# Patient Record
Sex: Female | Born: 1975 | Race: White | Hispanic: No | Marital: Married | Smoking: Current every day smoker
Health system: Southern US, Community
[De-identification: ages and names within clinical notes are randomized; demographics above are authoritative.]

## PROBLEM LIST (undated history)

## (undated) DIAGNOSIS — Z9189 Other specified personal risk factors, not elsewhere classified: Secondary | ICD-10-CM

## (undated) DIAGNOSIS — E785 Hyperlipidemia, unspecified: Secondary | ICD-10-CM

## (undated) DIAGNOSIS — F419 Anxiety disorder, unspecified: Secondary | ICD-10-CM

## (undated) DIAGNOSIS — G4700 Insomnia, unspecified: Secondary | ICD-10-CM

## (undated) DIAGNOSIS — Z803 Family history of malignant neoplasm of breast: Secondary | ICD-10-CM

## (undated) DIAGNOSIS — E119 Type 2 diabetes mellitus without complications: Secondary | ICD-10-CM

## (undated) DIAGNOSIS — R87810 Cervical high risk human papillomavirus (HPV) DNA test positive: Secondary | ICD-10-CM

## (undated) DIAGNOSIS — R87612 Low grade squamous intraepithelial lesion on cytologic smear of cervix (LGSIL): Secondary | ICD-10-CM

## (undated) DIAGNOSIS — N946 Dysmenorrhea, unspecified: Secondary | ICD-10-CM

## (undated) DIAGNOSIS — R87610 Atypical squamous cells of undetermined significance on cytologic smear of cervix (ASC-US): Secondary | ICD-10-CM

## (undated) DIAGNOSIS — E282 Polycystic ovarian syndrome: Secondary | ICD-10-CM

## (undated) DIAGNOSIS — Z9289 Personal history of other medical treatment: Secondary | ICD-10-CM

## (undated) DIAGNOSIS — Z1371 Encounter for nonprocreative screening for genetic disease carrier status: Secondary | ICD-10-CM

## (undated) DIAGNOSIS — E559 Vitamin D deficiency, unspecified: Secondary | ICD-10-CM

## (undated) DIAGNOSIS — B351 Tinea unguium: Secondary | ICD-10-CM

## (undated) HISTORY — DX: Anxiety disorder, unspecified: F41.9

## (undated) HISTORY — DX: Other specified personal risk factors, not elsewhere classified: Z91.89

## (undated) HISTORY — DX: Insomnia, unspecified: G47.00

## (undated) HISTORY — DX: Low grade squamous intraepithelial lesion on cytologic smear of cervix (LGSIL): R87.612

## (undated) HISTORY — DX: Type 2 diabetes mellitus without complications: E11.9

## (undated) HISTORY — DX: Hyperlipidemia, unspecified: E78.5

## (undated) HISTORY — DX: Family history of malignant neoplasm of breast: Z80.3

## (undated) HISTORY — DX: Personal history of other medical treatment: Z92.89

## (undated) HISTORY — DX: Dysmenorrhea, unspecified: N94.6

## (undated) HISTORY — DX: Vitamin D deficiency, unspecified: E55.9

## (undated) HISTORY — DX: Cervical high risk human papillomavirus (HPV) DNA test positive: R87.810

## (undated) HISTORY — DX: Tinea unguium: B35.1

## (undated) HISTORY — DX: Polycystic ovarian syndrome: E28.2

## (undated) HISTORY — DX: Atypical squamous cells of undetermined significance on cytologic smear of cervix (ASC-US): R87.610

## (undated) HISTORY — DX: Encounter for nonprocreative screening for genetic disease carrier status: Z13.71

## (undated) HISTORY — PX: COLPOSCOPY: SHX161

---

## 2002-04-20 HISTORY — PX: LAPAROSCOPIC CHOLECYSTECTOMY: SUR755

## 2005-04-20 HISTORY — PX: SKIN LESION EXCISION: SHX2412

## 2007-08-19 HISTORY — PX: INTRAUTERINE DEVICE (IUD) INSERTION: SHX5877

## 2009-05-15 DIAGNOSIS — R87612 Low grade squamous intraepithelial lesion on cytologic smear of cervix (LGSIL): Secondary | ICD-10-CM

## 2009-05-15 HISTORY — DX: Low grade squamous intraepithelial lesion on cytologic smear of cervix (LGSIL): R87.612

## 2011-01-29 ENCOUNTER — Ambulatory Visit: Payer: Self-pay

## 2012-05-30 HISTORY — PX: IUD REMOVAL: SHX5392

## 2012-06-18 HISTORY — PX: CERVICAL BIOPSY  W/ LOOP ELECTRODE EXCISION: SUR135

## 2014-06-12 DIAGNOSIS — B351 Tinea unguium: Secondary | ICD-10-CM

## 2014-06-12 HISTORY — DX: Tinea unguium: B35.1

## 2015-02-14 ENCOUNTER — Other Ambulatory Visit: Payer: Self-pay | Admitting: Obstetrics and Gynecology

## 2015-02-14 DIAGNOSIS — Z1231 Encounter for screening mammogram for malignant neoplasm of breast: Secondary | ICD-10-CM

## 2015-02-19 ENCOUNTER — Ambulatory Visit
Admission: RE | Admit: 2015-02-19 | Discharge: 2015-02-19 | Disposition: A | Discharge status: Home / Self Care | Payer: BLUE CROSS/BLUE SHIELD | Source: Ambulatory Visit | Attending: Obstetrics and Gynecology | Admitting: Obstetrics and Gynecology

## 2015-02-19 DIAGNOSIS — Z1231 Encounter for screening mammogram for malignant neoplasm of breast: Secondary | ICD-10-CM | POA: Insufficient documentation

## 2015-02-19 DIAGNOSIS — Z9289 Personal history of other medical treatment: Secondary | ICD-10-CM

## 2015-02-19 HISTORY — DX: Personal history of other medical treatment: Z92.89

## 2015-06-19 DIAGNOSIS — Z803 Family history of malignant neoplasm of breast: Secondary | ICD-10-CM

## 2015-06-19 DIAGNOSIS — Z1371 Encounter for nonprocreative screening for genetic disease carrier status: Secondary | ICD-10-CM

## 2015-06-19 HISTORY — DX: Family history of malignant neoplasm of breast: Z80.3

## 2015-06-19 HISTORY — DX: Encounter for nonprocreative screening for genetic disease carrier status: Z13.71

## 2015-06-25 DIAGNOSIS — Z9289 Personal history of other medical treatment: Secondary | ICD-10-CM

## 2015-06-25 HISTORY — DX: Personal history of other medical treatment: Z92.89

## 2015-06-25 LAB — HM PAP SMEAR: HM Pap smear: NEGATIVE

## 2015-07-23 DIAGNOSIS — E782 Mixed hyperlipidemia: Secondary | ICD-10-CM | POA: Diagnosis not present

## 2015-07-23 DIAGNOSIS — Z6841 Body Mass Index (BMI) 40.0 and over, adult: Secondary | ICD-10-CM | POA: Diagnosis not present

## 2015-07-23 DIAGNOSIS — Z1321 Encounter for screening for nutritional disorder: Secondary | ICD-10-CM | POA: Diagnosis not present

## 2015-08-19 DIAGNOSIS — Z9189 Other specified personal risk factors, not elsewhere classified: Secondary | ICD-10-CM

## 2015-08-19 HISTORY — DX: Other specified personal risk factors, not elsewhere classified: Z91.89

## 2015-08-22 DIAGNOSIS — Z315 Encounter for genetic counseling: Secondary | ICD-10-CM | POA: Diagnosis not present

## 2015-08-22 DIAGNOSIS — Z803 Family history of malignant neoplasm of breast: Secondary | ICD-10-CM | POA: Diagnosis not present

## 2015-09-06 DIAGNOSIS — J069 Acute upper respiratory infection, unspecified: Secondary | ICD-10-CM | POA: Diagnosis not present

## 2015-10-08 DIAGNOSIS — L821 Other seborrheic keratosis: Secondary | ICD-10-CM | POA: Diagnosis not present

## 2015-10-08 DIAGNOSIS — D485 Neoplasm of uncertain behavior of skin: Secondary | ICD-10-CM | POA: Diagnosis not present

## 2015-10-08 DIAGNOSIS — L708 Other acne: Secondary | ICD-10-CM | POA: Diagnosis not present

## 2015-11-18 DIAGNOSIS — H66001 Acute suppurative otitis media without spontaneous rupture of ear drum, right ear: Secondary | ICD-10-CM | POA: Diagnosis not present

## 2015-11-18 DIAGNOSIS — K120 Recurrent oral aphthae: Secondary | ICD-10-CM | POA: Diagnosis not present

## 2016-01-23 DIAGNOSIS — Z23 Encounter for immunization: Secondary | ICD-10-CM | POA: Diagnosis not present

## 2016-02-11 ENCOUNTER — Other Ambulatory Visit: Payer: Self-pay | Admitting: Obstetrics and Gynecology

## 2016-02-11 DIAGNOSIS — Z1231 Encounter for screening mammogram for malignant neoplasm of breast: Secondary | ICD-10-CM

## 2016-02-26 ENCOUNTER — Ambulatory Visit
Admission: RE | Admit: 2016-02-26 | Discharge: 2016-02-26 | Disposition: A | Discharge status: Home / Self Care | Payer: BLUE CROSS/BLUE SHIELD | Source: Ambulatory Visit | Attending: Obstetrics and Gynecology | Admitting: Obstetrics and Gynecology

## 2016-02-26 DIAGNOSIS — Z1231 Encounter for screening mammogram for malignant neoplasm of breast: Secondary | ICD-10-CM

## 2016-04-02 DIAGNOSIS — R202 Paresthesia of skin: Secondary | ICD-10-CM | POA: Diagnosis not present

## 2016-04-02 DIAGNOSIS — M5417 Radiculopathy, lumbosacral region: Secondary | ICD-10-CM | POA: Diagnosis not present

## 2016-04-02 DIAGNOSIS — M5418 Radiculopathy, sacral and sacrococcygeal region: Secondary | ICD-10-CM | POA: Diagnosis not present

## 2016-04-02 DIAGNOSIS — M545 Low back pain: Secondary | ICD-10-CM | POA: Diagnosis not present

## 2016-05-21 DIAGNOSIS — R7309 Other abnormal glucose: Secondary | ICD-10-CM | POA: Diagnosis not present

## 2016-05-21 DIAGNOSIS — E785 Hyperlipidemia, unspecified: Secondary | ICD-10-CM | POA: Diagnosis not present

## 2016-05-21 DIAGNOSIS — E559 Vitamin D deficiency, unspecified: Secondary | ICD-10-CM | POA: Diagnosis not present

## 2016-06-01 DIAGNOSIS — H6692 Otitis media, unspecified, left ear: Secondary | ICD-10-CM | POA: Diagnosis not present

## 2016-06-21 DIAGNOSIS — H66003 Acute suppurative otitis media without spontaneous rupture of ear drum, bilateral: Secondary | ICD-10-CM | POA: Diagnosis not present

## 2016-06-21 DIAGNOSIS — J0110 Acute frontal sinusitis, unspecified: Secondary | ICD-10-CM | POA: Diagnosis not present

## 2016-06-21 DIAGNOSIS — R05 Cough: Secondary | ICD-10-CM | POA: Diagnosis not present

## 2016-06-25 ENCOUNTER — Ambulatory Visit (INDEPENDENT_AMBULATORY_CARE_PROVIDER_SITE_OTHER): Payer: BLUE CROSS/BLUE SHIELD | Admitting: Obstetrics and Gynecology

## 2016-06-25 ENCOUNTER — Encounter: Payer: Self-pay | Admitting: Obstetrics and Gynecology

## 2016-06-25 VITALS — BP 130/82 | HR 95 | Ht 61.0 in | Wt 281.0 lb

## 2016-06-25 DIAGNOSIS — J302 Other seasonal allergic rhinitis: Secondary | ICD-10-CM | POA: Diagnosis not present

## 2016-06-25 DIAGNOSIS — E1169 Type 2 diabetes mellitus with other specified complication: Secondary | ICD-10-CM | POA: Diagnosis not present

## 2016-06-25 DIAGNOSIS — Z1231 Encounter for screening mammogram for malignant neoplasm of breast: Secondary | ICD-10-CM

## 2016-06-25 DIAGNOSIS — Z9189 Other specified personal risk factors, not elsewhere classified: Secondary | ICD-10-CM | POA: Diagnosis not present

## 2016-06-25 DIAGNOSIS — Z01419 Encounter for gynecological examination (general) (routine) without abnormal findings: Secondary | ICD-10-CM

## 2016-06-25 DIAGNOSIS — E785 Hyperlipidemia, unspecified: Secondary | ICD-10-CM | POA: Diagnosis not present

## 2016-06-25 DIAGNOSIS — E119 Type 2 diabetes mellitus without complications: Secondary | ICD-10-CM | POA: Diagnosis not present

## 2016-06-25 DIAGNOSIS — Z124 Encounter for screening for malignant neoplasm of cervix: Secondary | ICD-10-CM | POA: Diagnosis not present

## 2016-06-25 DIAGNOSIS — Z1239 Encounter for other screening for malignant neoplasm of breast: Secondary | ICD-10-CM

## 2016-06-25 MED ORDER — FLUTICASONE-SODIUM CHLORIDE 50-2.7 MCG/ACT-% NA THPK: 1.0000 | PACK | Freq: Two times a day (BID) | NASAL | 5 refills | Status: DC

## 2016-06-25 NOTE — Progress Notes (Signed)
HPI:      [name] is a 41 y.o. G0P0000 who LMP was Patient's last menstrual period was 05/31/2016 (exact date)., presents today for her annual examination.  Her menses are regular every 28-30 days, lasting 10 day(s) now.  Dysmenorrhea mild, occurring first 1-2 days of flow. Takes NSAIDs with relief. She has had 1-2 days bleeding after her period stops for the past 3 months. She has a hx of PCOS.  She is single partner, contraception - none.  Last Pap: June 25, 2015  Results were: no abnormalities /neg HPV DNA Last mammogram: February 26, 2016  Results were: normal--routine follow-up in 12 months There is a FH of breast cancer in her mom and mat aunt. There is no FH of ovarian cancer. PT had My Risk testing 2017 that was negative. The patient does not do self-breast exams. IBIS=24%. She is due for screening breast MRI. She is taking Vit D supp.  Tobacco use: The patient currently smokes 1/4 packs of cigarettes per day for the past many years. Alcohol use: none Exercise: not active  She does get adequate calcium and Vitamin D in her diet.  She has a hx of pre-DM that turned into DM 10/17. She has gained 20# since 5/17. She was on metformin 500 mg daily but was increased to BID 10/17. She is doing well with BID dosing. She needs repeat labs. Since she now has type 2 Dm, she is also candidate for ACE and statin.   She needs RF on flonase for seasonal allergies. She currently has a URI and was treated for ear infection this past wknd with 3 day course of Zpak.   Past Medical History:  Diagnosis Date  . Anxiety   . Borderline diabetes   . BRCA gene mutation negative in female 06/2015  . Cervical high risk HPV (human papillomavirus) test positive 05/15/09;05/22/10;12/16/10;2014   hpv pos  . Dysmenorrhea   . Family history of breast cancer 06/2015  . History of mammogram 02/19/2015   BIRAD I  . History of Papanicolaou smear of cervix 06/25/2015   RNIL;NEG  . Hyperlipidemia   .  Increased risk of breast cancer 08/2015   IBIS=24%  . Insomnia   . Onychomycosis 06/12/2014  . Pap smear abnormality of cervix with ASCUS favoring dysplasia 05/22/10;12/16/10;11/27/11;05/30/12  . Pap smear abnormality of cervix with LGSIL 05/15/2009  . Polycystic ovaries   . Vitamin D deficiency     Past Surgical History:  Procedure Laterality Date  . CERVICAL BIOPSY  W/ LOOP ELECTRODE EXCISION  06/2012   PATH NEG  . COLPOSCOPY  05/15/09;2/17;12/3; 5/13   NO LESIONS  . INTRAUTERINE DEVICE (IUD) INSERTION  08/2007  . IUD REMOVAL  05/30/2012  . LAPAROSCOPIC CHOLECYSTECTOMY  2004  . SKIN LESION EXCISION  2007   DERMATOFIBROMA    Family History  Problem Relation Age of Onset  . Breast cancer Maternal Aunt 86  . Breast cancer Mother 38  . Anemia Mother   . Hypertension Father   . Cancer Maternal Uncle   . Diabetes Maternal Grandmother   . Hypertension Paternal Grandmother   . Cancer Cousin      ROS:  Review of Systems  Constitutional: Negative for fever, malaise/fatigue and weight loss.  HENT: Negative for congestion, ear pain and sinus pain.   Respiratory: Positive for cough and wheezing. Negative for shortness of breath.   Cardiovascular: Negative for chest pain, orthopnea and leg swelling.  Gastrointestinal: Negative for constipation, diarrhea,  nausea and vomiting.  Genitourinary: Negative for dysuria, frequency, hematuria and urgency.       Breast ROS: negative   Musculoskeletal: Negative for back pain, joint pain and myalgias.  Skin: Negative for itching and rash.  Neurological: Negative for dizziness, tingling, focal weakness and headaches.  Endo/Heme/Allergies: Negative for environmental allergies. Does not bruise/bleed easily.  Psychiatric/Behavioral: Negative for depression and suicidal ideas. The patient is not nervous/anxious and does not have insomnia.     Objective: BP 130/82 (Patient Position: Sitting)   Pulse 95   Ht 5' 1"  (1.549 m)   Wt 281 lb (127.5 kg)    LMP 05/31/2016 (Exact Date)   BMI 53.09 kg/m    Physical Exam  Constitutional: She is oriented to person, place, and time. She appears well-developed and well-nourished.  Genitourinary: Vagina normal and uterus normal. No erythema or tenderness in the vagina. No vaginal discharge found. Right adnexum does not display mass and does not display tenderness. Left adnexum does not display mass and does not display tenderness. Cervix does not exhibit motion tenderness or polyp. Uterus is not enlarged or tender.  Neck: Normal range of motion. No thyromegaly present.  Cardiovascular: Normal rate, regular rhythm and normal heart sounds.   No murmur heard. Pulmonary/Chest: Effort normal and breath sounds normal. Right breast exhibits no mass, no nipple discharge, no skin change and no tenderness. Left breast exhibits no mass, no nipple discharge, no skin change and no tenderness.  Abdominal: Soft. There is no tenderness. There is no guarding.  Musculoskeletal: Normal range of motion.  Neurological: She is alert and oriented to person, place, and time. No cranial nerve deficit.  Psychiatric: She has a normal mood and affect. Her behavior is normal.  Vitals reviewed.   Results: No results found for this or any previous visit (from the past 24 hour(s)).  Assessment/Plan: Encounter for annual routine gynecological examination  Cervical cancer screening - Plan: Pap IG and HPV (high risk) DNA detection  Hyperlipidemia associated with type 2 diabetes mellitus ([place]) - Check labs. Will add statin due to DM, dose dependent on lab resutls.  - Plan: Pap IG and HPV (high risk) DNA detection, Comprehensive metabolic panel, Hemoglobin A1c, Urine Microalbumin w/creat. ratio, Lipid Panel With LDL/HDL Ratio, CANCELED: Lipid panel  Type 2 diabetes mellitus without complication, without long-term current use of insulin (H[facility]) - Check labs. Pt on metformin 500 mg BID. Diet/wt loss.  - Plan: Pap IG and HPV (high risk)  DNA detection, Comprehensive metabolic panel, Hemoglobin A1c, Urine Microalbumin w/creat. ratio, CANCELED: Lipid panel  Screening for breast cancer - Pt to sched through work, due 11/18. - Plan: MM DIGITAL SCREENING BILATERAL  Increased risk of breast cancer - My RIsk neg. IBIS=24%. DIscussed scr breast MRI before 5/18 if pt desires. Cont Vit D supp/yearly mammos. - Plan: MM DIGITAL SCREENING BILATERAL  Chronic seasonal allergic rhinitis, unspecified trigger              GYN counsel breast self exam, mammography screening, adequate intake of calcium and vitamin D, diet and exercise     F/U  Return in about 1 year (around 06/25/2017). for annual; depending on lab results.  Ixel Boehning B. Anaalicia Reimann, PA-C 06/25/2016 10:14 AM

## 2016-06-26 LAB — COMPREHENSIVE METABOLIC PANEL
A/G RATIO: 1.3 (ref 1.2–2.2)
ALK PHOS: 83 IU/L (ref 39–117)
ALT: 21 IU/L (ref 0–32)
AST: 16 IU/L (ref 0–40)
Albumin: 3.9 g/dL (ref 3.5–5.5)
BUN/Creatinine Ratio: 21 (ref 9–23)
BUN: 15 mg/dL (ref 6–24)
Bilirubin Total: <0.2 mg/dL (ref 0.0–1.2)
CO2: 18 mmol/L (ref 18–29)
Calcium: 9.2 mg/dL (ref 8.7–10.2)
Chloride: 102 mmol/L (ref 96–106)
Creatinine, Ser: 0.7 mg/dL (ref 0.57–1.00)
GFR calc Af Amer: 124 mL/min/{1.73_m2} (ref >59)
GFR calc non Af Amer: 107 mL/min/{1.73_m2} (ref >59)
GLOBULIN, TOTAL: 3 g/dL (ref 1.5–4.5)
Glucose: 121 mg/dL — ABNORMAL HIGH (ref 65–99)
Potassium: 5 mmol/L (ref 3.5–5.2)
SODIUM: 139 mmol/L (ref 134–144)
Total Protein: 6.9 g/dL (ref 6.0–8.5)

## 2016-06-26 LAB — HEMOGLOBIN A1C
ESTIMATED AVERAGE GLUCOSE: 146 mg/dL
HEMOGLOBIN A1C: 6.7 % — AB (ref 4.8–5.6)

## 2016-06-26 LAB — MICROALBUMIN / CREATININE URINE RATIO
Creatinine, Urine: 154 mg/dL
Microalb/Creat Ratio: 7.1 mg/g creat (ref 0.0–30.0)
Microalbumin, Urine: 10.9 ug/mL

## 2016-06-26 LAB — LIPID PANEL WITH LDL/HDL RATIO
CHOLESTEROL TOTAL: 160 mg/dL (ref 100–199)
HDL: 37 mg/dL — ABNORMAL LOW (ref >39)
LDL CALC: 80 mg/dL (ref 0–99)
LDl/HDL Ratio: 2.2 ratio units (ref 0.0–3.2)
Triglycerides: 216 mg/dL — ABNORMAL HIGH (ref 0–149)
VLDL CHOLESTEROL CAL: 43 mg/dL — AB (ref 5–40)

## 2016-06-29 ENCOUNTER — Telehealth: Payer: Self-pay | Admitting: Obstetrics and Gynecology

## 2016-06-29 DIAGNOSIS — E119 Type 2 diabetes mellitus without complications: Secondary | ICD-10-CM | POA: Insufficient documentation

## 2016-06-29 MED ORDER — METFORMIN HCL 500 MG PO TABS: 500.0000 mg | ORAL_TABLET | Freq: Two times a day (BID) | ORAL | 2 refills | Status: DC

## 2016-06-29 MED ORDER — ATORVASTATIN CALCIUM 10 MG PO TABS: 10.0000 mg | ORAL_TABLET | Freq: Every day | ORAL | 2 refills | Status: DC

## 2016-06-29 MED ORDER — LISINOPRIL 5 MG PO TABS: ORAL_TABLET | ORAL | 2 refills | Status: DC

## 2016-06-29 NOTE — Addendum Note (Signed)
Addended by: Althea GrimmerOPLAND, Anjulie Dipierro B on: 06/29/2016 12:06 PM   Modules accepted: Orders

## 2016-06-29 NOTE — Telephone Encounter (Signed)
PT aware of abn labs. Cont metformin 500 mg BID. Add lisinopril 5 mg and lipitor 10 mg. Pt aware of side effects. Rechk labs in 3 months.

## 2016-06-30 LAB — PAP IG AND HPV HIGH-RISK
HPV, high-risk: NEGATIVE
PAP Smear Comment: 0

## 2016-07-27 ENCOUNTER — Other Ambulatory Visit: Payer: Self-pay | Admitting: Obstetrics and Gynecology

## 2016-07-27 DIAGNOSIS — E785 Hyperlipidemia, unspecified: Principal | ICD-10-CM

## 2016-07-27 DIAGNOSIS — E1169 Type 2 diabetes mellitus with other specified complication: Secondary | ICD-10-CM

## 2016-07-27 DIAGNOSIS — E119 Type 2 diabetes mellitus without complications: Secondary | ICD-10-CM

## 2016-07-27 MED ORDER — ATORVASTATIN CALCIUM 10 MG PO TABS: 10.0000 mg | ORAL_TABLET | Freq: Every day | ORAL | 0 refills | Status: DC

## 2016-07-27 MED ORDER — LISINOPRIL 5 MG PO TABS: ORAL_TABLET | ORAL | 0 refills | Status: DC

## 2016-07-27 MED ORDER — METFORMIN HCL 500 MG PO TABS: 500.0000 mg | ORAL_TABLET | Freq: Two times a day (BID) | ORAL | 0 refills | Status: DC

## 2016-08-10 ENCOUNTER — Other Ambulatory Visit: Payer: Self-pay | Admitting: Obstetrics and Gynecology

## 2016-08-10 DIAGNOSIS — E119 Type 2 diabetes mellitus without complications: Secondary | ICD-10-CM

## 2016-08-10 MED ORDER — METFORMIN HCL 500 MG PO TABS: 500.0000 mg | ORAL_TABLET | Freq: Two times a day (BID) | ORAL | 0 refills | Status: DC

## 2016-08-13 ENCOUNTER — Encounter: Payer: Self-pay | Admitting: Obstetrics and Gynecology

## 2016-08-14 ENCOUNTER — Encounter: Payer: Self-pay | Admitting: Obstetrics and Gynecology

## 2016-09-24 ENCOUNTER — Other Ambulatory Visit: Payer: Self-pay | Admitting: Obstetrics and Gynecology

## 2016-09-24 DIAGNOSIS — E119 Type 2 diabetes mellitus without complications: Secondary | ICD-10-CM

## 2016-09-24 MED ORDER — LISINOPRIL 5 MG PO TABS: ORAL_TABLET | ORAL | 0 refills | Status: DC

## 2016-09-24 MED ORDER — METFORMIN HCL 500 MG PO TABS: 500.0000 mg | ORAL_TABLET | Freq: Two times a day (BID) | ORAL | 0 refills | Status: DC

## 2016-10-15 ENCOUNTER — Encounter: Payer: Self-pay | Admitting: Obstetrics and Gynecology

## 2016-11-24 ENCOUNTER — Other Ambulatory Visit: Payer: Self-pay | Admitting: Obstetrics and Gynecology

## 2016-11-24 DIAGNOSIS — E119 Type 2 diabetes mellitus without complications: Secondary | ICD-10-CM

## 2016-11-24 DIAGNOSIS — E1169 Type 2 diabetes mellitus with other specified complication: Secondary | ICD-10-CM

## 2016-11-24 DIAGNOSIS — E785 Hyperlipidemia, unspecified: Secondary | ICD-10-CM

## 2016-11-27 ENCOUNTER — Other Ambulatory Visit: Payer: BLUE CROSS/BLUE SHIELD

## 2016-11-27 DIAGNOSIS — E1169 Type 2 diabetes mellitus with other specified complication: Secondary | ICD-10-CM

## 2016-11-27 DIAGNOSIS — E119 Type 2 diabetes mellitus without complications: Secondary | ICD-10-CM | POA: Diagnosis not present

## 2016-11-27 DIAGNOSIS — E785 Hyperlipidemia, unspecified: Principal | ICD-10-CM

## 2016-11-28 LAB — COMPREHENSIVE METABOLIC PANEL
ALBUMIN: 4 g/dL (ref 3.5–5.5)
ALK PHOS: 91 IU/L (ref 39–117)
ALT: 17 IU/L (ref 0–32)
AST: 16 IU/L (ref 0–40)
Albumin/Globulin Ratio: 1.7 (ref 1.2–2.2)
BUN / CREAT RATIO: 13 (ref 9–23)
BUN: 9 mg/dL (ref 6–24)
CHLORIDE: 105 mmol/L (ref 96–106)
CO2: 21 mmol/L (ref 20–29)
Calcium: 9 mg/dL (ref 8.7–10.2)
Creatinine, Ser: 0.71 mg/dL (ref 0.57–1.00)
GFR calc Af Amer: 121 mL/min/{1.73_m2} (ref >59)
GFR calc non Af Amer: 105 mL/min/{1.73_m2} (ref >59)
GLUCOSE: 110 mg/dL — AB (ref 65–99)
Globulin, Total: 2.4 g/dL (ref 1.5–4.5)
Potassium: 5 mmol/L (ref 3.5–5.2)
SODIUM: 138 mmol/L (ref 134–144)
Total Protein: 6.4 g/dL (ref 6.0–8.5)

## 2016-11-28 LAB — LIPID PANEL
CHOLESTEROL TOTAL: 121 mg/dL (ref 100–199)
Chol/HDL Ratio: 3.5 ratio (ref 0.0–4.4)
HDL: 35 mg/dL — ABNORMAL LOW (ref >39)
LDL Calculated: 63 mg/dL (ref 0–99)
Triglycerides: 117 mg/dL (ref 0–149)
VLDL Cholesterol Cal: 23 mg/dL (ref 5–40)

## 2016-11-28 LAB — HEMOGLOBIN A1C
ESTIMATED AVERAGE GLUCOSE: 146 mg/dL
Hgb A1c MFr Bld: 6.7 % — ABNORMAL HIGH (ref 4.8–5.6)

## 2016-12-02 ENCOUNTER — Telehealth: Payer: Self-pay | Admitting: Obstetrics and Gynecology

## 2016-12-02 DIAGNOSIS — E119 Type 2 diabetes mellitus without complications: Secondary | ICD-10-CM

## 2016-12-02 DIAGNOSIS — E785 Hyperlipidemia, unspecified: Principal | ICD-10-CM

## 2016-12-02 DIAGNOSIS — E1169 Type 2 diabetes mellitus with other specified complication: Secondary | ICD-10-CM

## 2016-12-02 MED ORDER — ATORVASTATIN CALCIUM 10 MG PO TABS: 10.0000 mg | ORAL_TABLET | Freq: Every day | ORAL | 1 refills | Status: DC

## 2016-12-02 MED ORDER — LISINOPRIL 5 MG PO TABS: ORAL_TABLET | ORAL | 1 refills | Status: DC

## 2016-12-02 MED ORDER — METFORMIN HCL 500 MG PO TABS: 500.0000 mg | ORAL_TABLET | Freq: Two times a day (BID) | ORAL | 1 refills | Status: DC

## 2016-12-02 NOTE — Telephone Encounter (Signed)
Pt aware of lab results. Doing well on meds, no side effects. Taking lipitor 10 mg daily due to DM, metformin 500 mg BID, and lisinopril daily. HgA1C is stable at 6.7%. Cont meds. Rx RF for 6 months. Repeat labs in 6 months. Annual due in 7 months.

## 2016-12-06 ENCOUNTER — Encounter: Payer: Self-pay | Admitting: Obstetrics and Gynecology

## 2016-12-15 ENCOUNTER — Emergency Department
Admission: EM | Admit: 2016-12-15 | Discharge: 2016-12-15 | Disposition: A | Discharge status: Home / Self Care | Payer: BLUE CROSS/BLUE SHIELD | Attending: Emergency Medicine | Admitting: Emergency Medicine

## 2016-12-15 ENCOUNTER — Encounter: Payer: Self-pay | Admitting: Emergency Medicine

## 2016-12-15 DIAGNOSIS — M549 Dorsalgia, unspecified: Secondary | ICD-10-CM | POA: Diagnosis present

## 2016-12-15 DIAGNOSIS — Z7984 Long term (current) use of oral hypoglycemic drugs: Secondary | ICD-10-CM | POA: Diagnosis not present

## 2016-12-15 DIAGNOSIS — F17210 Nicotine dependence, cigarettes, uncomplicated: Secondary | ICD-10-CM | POA: Insufficient documentation

## 2016-12-15 DIAGNOSIS — E119 Type 2 diabetes mellitus without complications: Secondary | ICD-10-CM | POA: Insufficient documentation

## 2016-12-15 DIAGNOSIS — Z79899 Other long term (current) drug therapy: Secondary | ICD-10-CM | POA: Diagnosis not present

## 2016-12-15 DIAGNOSIS — M791 Myalgia: Secondary | ICD-10-CM | POA: Diagnosis not present

## 2016-12-15 DIAGNOSIS — R1031 Right lower quadrant pain: Secondary | ICD-10-CM | POA: Diagnosis not present

## 2016-12-15 DIAGNOSIS — M545 Low back pain: Secondary | ICD-10-CM | POA: Diagnosis not present

## 2016-12-15 DIAGNOSIS — R399 Unspecified symptoms and signs involving the genitourinary system: Secondary | ICD-10-CM | POA: Diagnosis not present

## 2016-12-15 LAB — COMPREHENSIVE METABOLIC PANEL
ALBUMIN: 3.9 g/dL (ref 3.5–5.0)
ALT: 19 U/L (ref 14–54)
ANION GAP: 10 (ref 5–15)
AST: 19 U/L (ref 15–41)
Alkaline Phosphatase: 82 U/L (ref 38–126)
BILIRUBIN TOTAL: 0.5 mg/dL (ref 0.3–1.2)
BUN: 13 mg/dL (ref 6–20)
CO2: 21 mmol/L — ABNORMAL LOW (ref 22–32)
Calcium: 9.2 mg/dL (ref 8.9–10.3)
Chloride: 105 mmol/L (ref 101–111)
Creatinine, Ser: 0.7 mg/dL (ref 0.44–1.00)
GFR calc non Af Amer: >60 mL/min (ref >60)
Glucose, Bld: 86 mg/dL (ref 65–99)
POTASSIUM: 4.2 mmol/L (ref 3.5–5.1)
SODIUM: 136 mmol/L (ref 135–145)
TOTAL PROTEIN: 7.4 g/dL (ref 6.5–8.1)

## 2016-12-15 LAB — URINALYSIS, COMPLETE (UACMP) WITH MICROSCOPIC
Bilirubin Urine: NEGATIVE
Glucose, UA: NEGATIVE mg/dL
Hgb urine dipstick: NEGATIVE
KETONES UR: NEGATIVE mg/dL
Leukocytes, UA: NEGATIVE
Nitrite: NEGATIVE
PROTEIN: NEGATIVE mg/dL
Specific Gravity, Urine: 1.01 (ref 1.005–1.030)
pH: 5 (ref 5.0–8.0)

## 2016-12-15 LAB — CBC
HCT: 40.1 % (ref 35.0–47.0)
HEMOGLOBIN: 13.7 g/dL (ref 12.0–16.0)
MCH: 31 pg (ref 26.0–34.0)
MCHC: 34.2 g/dL (ref 32.0–36.0)
MCV: 90.7 fL (ref 80.0–100.0)
Platelets: 383 10*3/uL (ref 150–440)
RBC: 4.42 MIL/uL (ref 3.80–5.20)
RDW: 13.6 % (ref 11.5–14.5)
WBC: 17 10*3/uL — ABNORMAL HIGH (ref 3.6–11.0)

## 2016-12-15 LAB — LIPASE, BLOOD: Lipase: 46 U/L (ref 11–51)

## 2016-12-15 MED ORDER — DIAZEPAM 5 MG PO TABS: 5.0000 mg | ORAL_TABLET | Freq: Three times a day (TID) | ORAL | 0 refills | Status: DC | PRN

## 2016-12-15 MED ORDER — DIAZEPAM 5 MG PO TABS
5.0000 mg | ORAL_TABLET | Freq: Once | ORAL | Status: AC
  Administered 2016-12-15: 5 mg via ORAL
  Filled 2016-12-15: qty 1

## 2016-12-15 MED ORDER — KETOROLAC TROMETHAMINE 60 MG/2ML IM SOLN
15.0000 mg | Freq: Once | INTRAMUSCULAR | Status: AC
  Administered 2016-12-15: 15 mg via INTRAMUSCULAR
  Filled 2016-12-15: qty 2

## 2016-12-15 MED ORDER — KETOROLAC TROMETHAMINE 10 MG PO TABS: 10.0000 mg | ORAL_TABLET | Freq: Four times a day (QID) | ORAL | 0 refills | Status: DC | PRN

## 2016-12-15 NOTE — ED Provider Notes (Signed)
[facility] Emergency Department Provider Note  ____________________________________________  Time seen: Approximately 5:13 PM  I have reviewed the triage vital signs and the nursing notes.   HISTORY  Chief Complaint Abdominal Pain    HPI Jaime Gilmore is a 41 y.o. female who complains of right-sided abdominal pain that started yesterday worse with movement while she was at the [employer] shopping. No slips trips falls, no specific movement or activity that precipitated it that she can think of. She had this once before and it resolved after a few days spontaneously. No nausea vomiting diarrhea fevers chills or sweats. No chest pain or shortness of breath.  Back pain is at the right flank. Worse with any movement. Better with rest. Moderate, crampy. Constant waxing and waning.     Past Medical History:  Diagnosis Date  . Anxiety   . Borderline diabetes   . BRCA gene mutation negative in female 06/2015  . Cervical high risk HPV (human papillomavirus) test positive 05/15/09;05/22/10;12/16/10;2014   hpv pos  . Dysmenorrhea   . Family history of breast cancer 06/2015  . History of mammogram 02/19/2015   BIRAD I  . History of Papanicolaou smear of cervix 06/25/2015   RNIL;NEG  . Hyperlipidemia   . Increased risk of breast cancer 08/2015   IBIS=24%  . Insomnia   . Onychomycosis 06/12/2014  . Pap smear abnormality of cervix with ASCUS favoring dysplasia 05/22/10;12/16/10;11/27/11;05/30/12  . Pap smear abnormality of cervix with LGSIL 05/15/2009  . Polycystic ovaries   . Vitamin D deficiency      Patient Active Problem List   Diagnosis Date Noted  . Type 2 diabetes mellitus (HCC) 06/29/2016  . Encounter for annual routine gynecological examination 06/25/2016  . Increased risk of breast cancer 06/25/2016     Past Surgical History:  Procedure Laterality Date  . CERVICAL BIOPSY  W/ LOOP ELECTRODE EXCISION  06/2012   PATH NEG  . COLPOSCOPY   05/15/09;2/17;12/3; 5/13   NO LESIONS  . INTRAUTERINE DEVICE (IUD) INSERTION  08/2007  . IUD REMOVAL  05/30/2012  . LAPAROSCOPIC CHOLECYSTECTOMY  2004  . SKIN LESION EXCISION  2007   DERMATOFIBROMA     Prior to Admission medications   Medication Sig Start Date End Date Taking? Authorizing Provider  ALPRAZolam (XANAX) 0.5 MG tablet Take 0.5 mg by mouth at bedtime as needed for anxiety (sxs). 04/23/14   [provider]  atorvastatin (LIPITOR) 10 MG tablet Take 1 tablet (10 mg total) by mouth daily. 12/02/16   Copland, Alicia B, PA-C  Cholecalciferol (VITAMIN D3) 5000 units TABS Take by mouth.    [provider]  diazepam (VALIUM) 5 MG tablet Take 1 tablet (5 mg total) by mouth every 8 (eight) hours as needed for muscle spasms. 12/15/16   Stafford, Phillip, MD  Fluticasone-Sodium Chloride 50-2.7 MCG/ACT-% THPK Place 1 spray into the nose 2 (two) times daily. 06/25/16   Copland, Alicia B, PA-C  ketorolac (TORADOL) 10 MG tablet Take 1 tablet (10 mg total) by mouth every 6 (six) hours as needed for moderate pain. 12/15/16   Stafford, Phillip, MD  lisinopril (PRINIVIL) 5 MG tablet Take 1 tablet daily 12/02/16   Copland, Alicia B, PA-C  metFORMIN (GLUCOPHAGE) 500 MG tablet Take 1 tablet (500 mg total) by mouth 2 (two) times daily with a meal. 12/02/16   Copland, Alicia B, PA-C  phentermine 37.5 MG capsule Take 37.5 mg by mouth every morning. 11/05/14   [provider]       Allergies Patient has no known allergies.   Family History  Problem Relation Age of Onset  . Breast cancer Maternal Aunt 46  . Breast cancer Mother 41  . Anemia Mother   . Hypertension Father   . Cancer Maternal Uncle   . Diabetes Maternal Grandmother   . Hypertension Paternal Grandmother   . Cancer Cousin     Social History Social History  Substance Use Topics  . Smoking status: Current Every Day Smoker    Packs/day: 1.00  . Smokeless tobacco: Never Used  . Alcohol use No    Review of  Systems  Constitutional:   No fever or chills.  ENT:   No sore throat. No rhinorrhea. Cardiovascular:   No chest pain or syncope. Respiratory:   No dyspnea or cough. Gastrointestinal:   Negative for abdominal pain, vomiting and diarrhea. No urinary symptoms Musculoskeletal:   Right lower back pain as above. All other systems reviewed and are negative except as documented above in ROS and HPI.  ____________________________________________   PHYSICAL EXAM:  VITAL SIGNS: ED Triage Vitals  Enc Vitals Group     BP 12/15/16 1312 100/74     Pulse Rate 12/15/16 1312 100     Resp 12/15/16 1312 18     Temp 12/15/16 1312 98.2 F (36.8 C)     Temp Source 12/15/16 1312 Oral     SpO2 12/15/16 1312 100 %     Weight 12/15/16 1312 275 lb (124.7 kg)     Height 12/15/16 1312 5' 1" (1.549 m)     Head Circumference --      Peak Flow --      Pain Score 12/15/16 1311 6     Pain Loc --      Pain Edu? --      Excl. in [place]? --     Vital signs reviewed, nursing assessments reviewed.   Constitutional:   Alert and oriented. Well appearing and in no distress. Eyes:   No scleral icterus.  EOMI. No nystagmus. No conjunctival pallor. PERRL. ENT   Head:   Normocephalic and atraumatic.   Nose:   No congestion/rhinnorhea.    Mouth/Throat:   MMM, no pharyngeal erythema. No peritonsillar mass.    Neck:   No meningismus. Full ROM Hematological/Lymphatic/Immunilogical:   No cervical lymphadenopathy. Cardiovascular:   RRR. Symmetric bilateral radial and DP pulses.  No murmurs.  Respiratory:   Normal respiratory effort without tachypnea/retractions. Breath sounds are clear and equal bilaterally. No wheezes/rales/rhonchi. Gastrointestinal:   Soft and nontender. Non distended. There is no CVA tenderness.  No rebound, rigidity, or guarding. Genitourinary:   deferred Musculoskeletal:   Normal range of motion in all extremities. No joint effusions.  No lower extremity tenderness.  No edema.right lower  back very tender to the touch in the paraspinous musculature. Pain is worsened with twisting motion of the trunk to the left. Neurologic:   Normal speech and language.  Motor grossly intact. No gross focal neurologic deficits are appreciated.  Skin:    Skin is warm, dry and intact. No rash noted.  No petechiae, purpura, or bullae.  ____________________________________________    LABS (pertinent positives/negatives) (all labs ordered are listed, but only abnormal results are displayed) Labs Reviewed  COMPREHENSIVE METABOLIC PANEL - Abnormal; Notable for the following:       Result Value   CO2 21 (*)    All other components within normal limits  CBC - Abnormal; Notable for the following:    WBC 17.0 (*)  All other components within normal limits  URINALYSIS, COMPLETE (UACMP) WITH MICROSCOPIC - Abnormal; Notable for the following:    Color, Urine YELLOW (*)    APPearance CLEAR (*)    Bacteria, UA FEW (*)    Squamous Epithelial / LPF 0-5 (*)    All other components within normal limits  LIPASE, BLOOD  POC URINE PREG, ED   ____________________________________________   EKG    ____________________________________________    RADIOLOGY  No results found.  ____________________________________________   PROCEDURES Procedures  ____________________________________________   INITIAL IMPRESSION / ASSESSMENT AND PLAN / ED COURSE  Pertinent labs & imaging results that were available during my care of the patient were reviewed by me and considered in my medical decision making (see chart for details).  Patient well appearing no acute distress, reports that she feels she just cramp. Clinically this appears to be the case that she does havemuscular tenderness.Considering the patient's symptoms, medical history, and physical examination today, I have low suspicion for cholecystitis or biliary pathology, pancreatitis, perforation or bowel obstruction, hernia, intra-abdominal  abscess, AAA or dissection, volvulus or intussusception, mesenteric ischemia, or appendicitis.  Vital signs are normal, she does have a leukocytosis of 17,000 which I think is pain related and not due to acute infection. Abdomen is benign and nonsurgical. Low suspicion for epidural abscess. No evidence of urinary tract infection.Toradol, Valium, follow-up primary care.      ____________________________________________   FINAL CLINICAL IMPRESSION(S) / ED DIAGNOSES  Final diagnoses:  Musculoskeletal back pain      New Prescriptions   DIAZEPAM (VALIUM) 5 MG TABLET    Take 1 tablet (5 mg total) by mouth every 8 (eight) hours as needed for muscle spasms.   KETOROLAC (TORADOL) 10 MG TABLET    Take 1 tablet (10 mg total) by mouth every 6 (six) hours as needed for moderate pain.     Portions of this note were generated with dragon dictation software. Dictation errors may occur despite best attempts at proofreading.    Carrie Mew, MD 12/15/16 [phone]

## 2016-12-15 NOTE — ED Triage Notes (Signed)
Pt to ed with c/o right side abd pain that started yesterday, reports pain worse with movement.  Sent from [facility]   Denies n/v/d.

## 2016-12-22 ENCOUNTER — Telehealth: Payer: Self-pay | Admitting: Obstetrics and Gynecology

## 2016-12-22 ENCOUNTER — Encounter: Payer: Self-pay | Admitting: Obstetrics and Gynecology

## 2016-12-22 ENCOUNTER — Other Ambulatory Visit: Payer: Self-pay | Admitting: Obstetrics and Gynecology

## 2016-12-22 DIAGNOSIS — Z23 Encounter for immunization: Secondary | ICD-10-CM

## 2016-12-22 NOTE — Telephone Encounter (Signed)
Patient has an appointment tomorrow for tdap.  Please place order in Epic.

## 2016-12-22 NOTE — Telephone Encounter (Signed)
Done

## 2016-12-23 ENCOUNTER — Ambulatory Visit: Payer: BLUE CROSS/BLUE SHIELD

## 2017-01-05 ENCOUNTER — Encounter: Payer: Self-pay | Admitting: Obstetrics and Gynecology

## 2017-01-21 DIAGNOSIS — Z23 Encounter for immunization: Secondary | ICD-10-CM | POA: Diagnosis not present

## 2017-03-04 ENCOUNTER — Encounter: Payer: Self-pay | Admitting: Obstetrics and Gynecology

## 2017-03-04 ENCOUNTER — Ambulatory Visit
Admission: RE | Admit: 2017-03-04 | Discharge: 2017-03-04 | Disposition: A | Discharge status: Home / Self Care | Payer: BLUE CROSS/BLUE SHIELD | Source: Ambulatory Visit | Attending: Obstetrics and Gynecology | Admitting: Obstetrics and Gynecology

## 2017-03-04 DIAGNOSIS — Z1231 Encounter for screening mammogram for malignant neoplasm of breast: Secondary | ICD-10-CM | POA: Insufficient documentation

## 2017-03-04 DIAGNOSIS — Z1239 Encounter for other screening for malignant neoplasm of breast: Secondary | ICD-10-CM

## 2017-03-04 DIAGNOSIS — Z9189 Other specified personal risk factors, not elsewhere classified: Secondary | ICD-10-CM

## 2017-05-24 ENCOUNTER — Encounter: Payer: Self-pay | Admitting: Obstetrics and Gynecology

## 2017-05-24 NOTE — Telephone Encounter (Signed)
Please advise 

## 2017-06-06 ENCOUNTER — Other Ambulatory Visit: Payer: Self-pay | Admitting: Obstetrics and Gynecology

## 2017-06-06 DIAGNOSIS — E119 Type 2 diabetes mellitus without complications: Secondary | ICD-10-CM

## 2017-06-06 DIAGNOSIS — E785 Hyperlipidemia, unspecified: Principal | ICD-10-CM

## 2017-06-06 DIAGNOSIS — E1169 Type 2 diabetes mellitus with other specified complication: Secondary | ICD-10-CM

## 2017-06-15 ENCOUNTER — Encounter: Payer: Self-pay | Admitting: Obstetrics and Gynecology

## 2017-06-15 ENCOUNTER — Other Ambulatory Visit: Payer: Self-pay | Admitting: Obstetrics and Gynecology

## 2017-06-15 DIAGNOSIS — E785 Hyperlipidemia, unspecified: Principal | ICD-10-CM

## 2017-06-15 DIAGNOSIS — E1169 Type 2 diabetes mellitus with other specified complication: Secondary | ICD-10-CM

## 2017-06-15 DIAGNOSIS — E119 Type 2 diabetes mellitus without complications: Secondary | ICD-10-CM

## 2017-06-16 NOTE — Telephone Encounter (Signed)
Please advise 

## 2017-06-16 NOTE — Telephone Encounter (Signed)
Please place order.

## 2017-06-17 MED ORDER — ATORVASTATIN CALCIUM 10 MG PO TABS: 10.0000 mg | ORAL_TABLET | Freq: Every day | ORAL | 0 refills | Status: DC

## 2017-06-24 ENCOUNTER — Encounter: Payer: Self-pay | Admitting: Obstetrics and Gynecology

## 2017-06-28 ENCOUNTER — Other Ambulatory Visit: Payer: BLUE CROSS/BLUE SHIELD

## 2017-06-28 DIAGNOSIS — E785 Hyperlipidemia, unspecified: Secondary | ICD-10-CM

## 2017-06-28 DIAGNOSIS — E119 Type 2 diabetes mellitus without complications: Secondary | ICD-10-CM | POA: Diagnosis not present

## 2017-06-28 DIAGNOSIS — E1169 Type 2 diabetes mellitus with other specified complication: Secondary | ICD-10-CM | POA: Diagnosis not present

## 2017-06-29 ENCOUNTER — Telehealth: Payer: Self-pay | Admitting: Obstetrics and Gynecology

## 2017-06-29 DIAGNOSIS — E785 Hyperlipidemia, unspecified: Secondary | ICD-10-CM

## 2017-06-29 DIAGNOSIS — E1169 Type 2 diabetes mellitus with other specified complication: Secondary | ICD-10-CM

## 2017-06-29 DIAGNOSIS — Z1321 Encounter for screening for nutritional disorder: Secondary | ICD-10-CM

## 2017-06-29 DIAGNOSIS — E119 Type 2 diabetes mellitus without complications: Secondary | ICD-10-CM

## 2017-06-29 LAB — COMPREHENSIVE METABOLIC PANEL
ALBUMIN: 3.7 g/dL (ref 3.5–5.5)
ALT: 14 IU/L (ref 0–32)
AST: 12 IU/L (ref 0–40)
Albumin/Globulin Ratio: 1.3 (ref 1.2–2.2)
Alkaline Phosphatase: 90 IU/L (ref 39–117)
BUN / CREAT RATIO: 14 (ref 9–23)
BUN: 9 mg/dL (ref 6–24)
Bilirubin Total: <0.2 mg/dL (ref 0.0–1.2)
CO2: 19 mmol/L — AB (ref 20–29)
CREATININE: 0.66 mg/dL (ref 0.57–1.00)
Calcium: 9.3 mg/dL (ref 8.7–10.2)
Chloride: 102 mmol/L (ref 96–106)
GFR, EST AFRICAN AMERICAN: 125 mL/min/{1.73_m2} (ref >59)
GFR, EST NON AFRICAN AMERICAN: 109 mL/min/{1.73_m2} (ref >59)
GLUCOSE: 135 mg/dL — AB (ref 65–99)
Globulin, Total: 2.9 g/dL (ref 1.5–4.5)
Potassium: 4.7 mmol/L (ref 3.5–5.2)
Sodium: 140 mmol/L (ref 134–144)
TOTAL PROTEIN: 6.6 g/dL (ref 6.0–8.5)

## 2017-06-29 LAB — MICROALBUMIN / CREATININE URINE RATIO
Creatinine, Urine: 118.6 mg/dL
Microalb/Creat Ratio: 7.6 mg/g creat (ref 0.0–30.0)
Microalbumin, Urine: 9 ug/mL

## 2017-06-29 LAB — HEMOGLOBIN A1C
ESTIMATED AVERAGE GLUCOSE: 143 mg/dL
HEMOGLOBIN A1C: 6.6 % — AB (ref 4.8–5.6)

## 2017-06-29 LAB — LIPID PANEL
Chol/HDL Ratio: 3.4 ratio (ref 0.0–4.4)
Cholesterol, Total: 148 mg/dL (ref 100–199)
HDL: 44 mg/dL (ref >39)
LDL Calculated: 69 mg/dL (ref 0–99)
Triglycerides: 176 mg/dL — ABNORMAL HIGH (ref 0–149)
VLDL CHOLESTEROL CAL: 35 mg/dL (ref 5–40)

## 2017-06-29 MED ORDER — ATORVASTATIN CALCIUM 10 MG PO TABS: 10.0000 mg | ORAL_TABLET | Freq: Every day | ORAL | 1 refills | Status: DC

## 2017-06-29 MED ORDER — METFORMIN HCL 500 MG PO TABS: 500.0000 mg | ORAL_TABLET | Freq: Two times a day (BID) | ORAL | 1 refills | Status: DC

## 2017-06-29 MED ORDER — LISINOPRIL 5 MG PO TABS: ORAL_TABLET | ORAL | 1 refills | Status: DC

## 2017-06-29 NOTE — Telephone Encounter (Signed)
Pt aware of labs. Taking metformin 500 mg BID, lisinopril and lipitor daily. Rx RF. Cont diet/exercise/wt loss. REchk labs in 6 months. Has annual 07/06/17.

## 2017-07-06 ENCOUNTER — Encounter: Payer: Self-pay | Admitting: Obstetrics and Gynecology

## 2017-07-06 ENCOUNTER — Ambulatory Visit (INDEPENDENT_AMBULATORY_CARE_PROVIDER_SITE_OTHER): Payer: BLUE CROSS/BLUE SHIELD | Admitting: Obstetrics and Gynecology

## 2017-07-06 VITALS — BP 122/88 | HR 111 | Ht 61.0 in | Wt 280.0 lb

## 2017-07-06 DIAGNOSIS — Z9189 Other specified personal risk factors, not elsewhere classified: Secondary | ICD-10-CM

## 2017-07-06 DIAGNOSIS — Z1231 Encounter for screening mammogram for malignant neoplasm of breast: Secondary | ICD-10-CM | POA: Diagnosis not present

## 2017-07-06 DIAGNOSIS — E282 Polycystic ovarian syndrome: Secondary | ICD-10-CM | POA: Diagnosis not present

## 2017-07-06 DIAGNOSIS — Z01419 Encounter for gynecological examination (general) (routine) without abnormal findings: Secondary | ICD-10-CM | POA: Diagnosis not present

## 2017-07-06 DIAGNOSIS — E119 Type 2 diabetes mellitus without complications: Secondary | ICD-10-CM | POA: Diagnosis not present

## 2017-07-06 DIAGNOSIS — E785 Hyperlipidemia, unspecified: Secondary | ICD-10-CM

## 2017-07-06 DIAGNOSIS — Z713 Dietary counseling and surveillance: Secondary | ICD-10-CM

## 2017-07-06 DIAGNOSIS — E1169 Type 2 diabetes mellitus with other specified complication: Secondary | ICD-10-CM | POA: Diagnosis not present

## 2017-07-06 DIAGNOSIS — Z1239 Encounter for other screening for malignant neoplasm of breast: Secondary | ICD-10-CM

## 2017-07-06 NOTE — Patient Instructions (Signed)
I value your feedback and entrusting us with your care. If you get a [facility] patient survey, I would appreciate you taking the time to let us know about your experience today. Thank you! 

## 2017-07-06 NOTE — Addendum Note (Signed)
Addended by: Althea GrimmerOPLAND, ALICIA B on: 07/06/2017 08:46 AM   Modules accepted: Orders

## 2017-07-06 NOTE — Progress Notes (Addendum)
HPI:      [name] is a 42 y.o. G0P0000 who LMP was Patient's last menstrual period was 06/22/2017., presents today for her annual examination. Her menses are regular every 28-30 days, lasting 7 day(s) now.  Dysmenorrhea mild, occurring first 1-2 days of flow. Takes NSAIDs with relief. No  BTB, except 1 day last cycle. She has a hx of PCOS.  She is single partner, contraception - none.  Last Pap: June 25, 2015  Results were: no abnormalities /neg HPV DNA  Last mammogram: 03/04/17  Results were: normal--routine follow-up in 12 months There is a FH of breast cancer in her mom and mat aunt. There is no FH of ovarian cancer. PT had My Risk testing 2017 that was negative. The patient does do self-breast exams. IBIS=24%. She has not had screening breast MRI and declines this yr. She is taking Vit D supp.  Tobacco use: The patient currently smokes 1/2 packs of cigarettes per day for the past many years. Alcohol use: none Exercise: not active  She does get adequate calcium and Vitamin D in her diet.  She has a hx of pre-DM that turned into DM 10/17. She takes metformin BID. She takes ACEI and lipitor for DM as well. No side effects. Rx RF given for 6 months with recent labs. Repeat labs due 9/19. Orders already in system.  Recent Results (from the past 2160 hour(s))  Comprehensive metabolic panel     Status: Abnormal   Collection Time: 06/28/17  8:49 AM  Result Value Ref Range   Glucose 135 (H) 65 - 99 mg/dL   BUN 9 6 - 24 mg/dL   Creatinine, Ser 0.66 0.57 - 1.00 mg/dL   GFR calc non Af Amer 109 >59 mL/min/1.73   GFR calc Af Amer 125 >59 mL/min/1.73   BUN/Creatinine Ratio 14 9 - 23   Sodium 140 134 - 144 mmol/L   Potassium 4.7 3.5 - 5.2 mmol/L   Chloride 102 96 - 106 mmol/L   CO2 19 (L) 20 - 29 mmol/L   Calcium 9.3 8.7 - 10.2 mg/dL   Total Protein 6.6 6.0 - 8.5 g/dL   Albumin 3.7 3.5 - 5.5 g/dL   Globulin, Total 2.9 1.5 - 4.5 g/dL   Albumin/Globulin Ratio 1.3 1.2 -  2.2   Bilirubin Total <0.2 0.0 - 1.2 mg/dL   Alkaline Phosphatase 90 39 - 117 IU/L   AST 12 0 - 40 IU/L   ALT 14 0 - 32 IU/L  Lipid panel     Status: Abnormal   Collection Time: 06/28/17  8:49 AM  Result Value Ref Range   Cholesterol, Total 148 100 - 199 mg/dL   Triglycerides 176 (H) 0 - 149 mg/dL   HDL 44 >39 mg/dL   VLDL Cholesterol Cal 35 5 - 40 mg/dL   LDL Calculated 69 0 - 99 mg/dL   Chol/HDL Ratio 3.4 0.0 - 4.4 ratio    Comment:                                   T. Chol/HDL Ratio                                             Men  Women  1/2 Avg.Risk  3.4    3.3                                   Avg.Risk  5.0    4.4                                2X Avg.Risk  9.6    7.1                                3X Avg.Risk 23.4   11.0   Hemoglobin A1c     Status: Abnormal   Collection Time: 06/28/17  8:49 AM  Result Value Ref Range   Hgb A1c MFr Bld 6.6 (H) 4.8 - 5.6 %    Comment:          Prediabetes: 5.7 - 6.4          Diabetes: >6.4          Glycemic control for adults with diabetes: <7.0    Est. average glucose Bld gHb Est-mCnc 143 mg/dL  Urine Microalbumin w/creat. ratio     Status: None   Collection Time: 06/28/17  8:53 AM  Result Value Ref Range   Creatinine, Urine 118.6 Not Estab. mg/dL   Microalbumin, Urine 9.0 Not Estab. ug/mL   Microalb/Creat Ratio 7.6 0.0 - 30.0 mg/g creat    Comment:                      Normal:                0.0 -  30.0                      Albuminuria:          31.0 - 300.0                      Clinical albuminuria:       >300.0      Past Medical History:  Diagnosis Date  . Anxiety   . BRCA gene mutation negative in female 06/2015  . Cervical high risk HPV (human papillomavirus) test positive 05/15/09;05/22/10;12/16/10;2014   hpv pos  . Dysmenorrhea   . Family history of breast cancer 06/2015  . History of mammogram 02/19/2015   BIRAD I  . History of Papanicolaou smear of cervix 06/25/2015   RNIL;NEG  .  Hyperlipidemia   . Increased risk of breast cancer 08/2015   IBIS=24%  . Insomnia   . Onychomycosis 06/12/2014  . Pap smear abnormality of cervix with ASCUS favoring dysplasia 05/22/10;12/16/10;11/27/11;05/30/12  . Pap smear abnormality of cervix with LGSIL 05/15/2009  . Polycystic ovaries   . Type 2 diabetes mellitus ([place])   . Vitamin D deficiency     Past Surgical History:  Procedure Laterality Date  . CERVICAL BIOPSY  W/ LOOP ELECTRODE EXCISION  06/2012   PATH NEG  . COLPOSCOPY  05/15/09;2/17;12/3; 5/13   NO LESIONS  . INTRAUTERINE DEVICE (IUD) INSERTION  08/2007  . IUD REMOVAL  05/30/2012  . LAPAROSCOPIC CHOLECYSTECTOMY  2004  . SKIN LESION EXCISION  2007   DERMATOFIBROMA    Family History  Problem Relation Age of Onset  . Breast cancer Maternal Aunt 69  . Breast cancer Mother  41  . Anemia Mother   . Hypertension Father   . Cancer Maternal Uncle   . Diabetes Maternal Grandmother   . Hypertension Paternal Grandmother   . Cancer Cousin     Social History   Socioeconomic History  . Marital status: Married    Spouse name: Not on file  . Number of children: 0  . Years of education: 38  . Highest education level: Not on file  Social Needs  . Financial resource strain: Not on file  . Food insecurity - worry: Not on file  . Food insecurity - inability: Not on file  . Transportation needs - medical: Not on file  . Transportation needs - non-medical: Not on file  Occupational History  . Occupation: business  Tobacco Use  . Smoking status: Current Every Day Smoker    Packs/day: 1.00  . Smokeless tobacco: Never Used  Substance and Sexual Activity  . Alcohol use: No  . Drug use: No  . Sexual activity: Yes    Birth control/protection: Condom  Other Topics Concern  . Not on file  Social History Narrative  . Not on file    Current Outpatient Medications on File Prior to Visit  Medication Sig Dispense Refill  . ALPRAZolam (XANAX) 0.5 MG tablet Take 0.5 mg by mouth at  bedtime as needed for anxiety (sxs).    Marland Kitchen atorvastatin (LIPITOR) 10 MG tablet Take 1 tablet (10 mg total) by mouth daily. 90 tablet 1  . Cholecalciferol (VITAMIN D3) 5000 units TABS Take by mouth.    . diazepam (VALIUM) 5 MG tablet Take 1 tablet (5 mg total) by mouth every 8 (eight) hours as needed for muscle spasms. 8 tablet 0  . Fluticasone-Sodium Chloride 50-2.7 MCG/ACT-% THPK Place 1 spray into the nose 2 (two) times daily. 1 each 5  . ketorolac (TORADOL) 10 MG tablet Take 1 tablet (10 mg total) by mouth every 6 (six) hours as needed for moderate pain. 12 tablet 0  . lisinopril (PRINIVIL) 5 MG tablet Take 1 tablet daily 90 tablet 1  . metFORMIN (GLUCOPHAGE) 500 MG tablet Take 1 tablet (500 mg total) by mouth 2 (two) times daily with a meal. 180 tablet 1  . phentermine 37.5 MG capsule Take 37.5 mg by mouth every morning.     No current facility-administered medications on file prior to visit.       ROS:  Review of Systems  Constitutional: Negative for fatigue, fever and unexpected weight change.  Respiratory: Negative for cough, shortness of breath and wheezing.   Cardiovascular: Negative for chest pain, palpitations and leg swelling.  Gastrointestinal: Negative for blood in stool, constipation, diarrhea, nausea and vomiting.  Endocrine: Negative for cold intolerance, heat intolerance and polyuria.  Genitourinary: Negative for dyspareunia, dysuria, flank pain, frequency, genital sores, hematuria, menstrual problem, pelvic pain, urgency, vaginal bleeding, vaginal discharge and vaginal pain.  Musculoskeletal: Negative for back pain, joint swelling and myalgias.  Skin: Negative for rash.  Neurological: Negative for dizziness, syncope, light-headedness, numbness and headaches.  Hematological: Negative for adenopathy.  Psychiatric/Behavioral: Positive for agitation. Negative for confusion, sleep disturbance and suicidal ideas. The patient is not nervous/anxious.      Objective: BP  122/88   Pulse (!) 111   Ht 5' 1"  (1.549 m)   Wt 280 lb (127 kg)   LMP 06/22/2017   BMI 52.91 kg/m    Physical Exam  Constitutional: She is oriented to person, place, and time. She appears well-developed and well-nourished.  Genitourinary:  Vagina normal and uterus normal. There is no rash or tenderness on the right labia. There is no rash or tenderness on the left labia. No erythema or tenderness in the vagina. No vaginal discharge found. Right adnexum does not display mass and does not display tenderness. Left adnexum does not display mass and does not display tenderness. Cervix does not exhibit motion tenderness or polyp. Uterus is not enlarged or tender.  Neck: Normal range of motion. No thyromegaly present.  Cardiovascular: Normal rate, regular rhythm and normal heart sounds.  No murmur heard. Pulmonary/Chest: Effort normal and breath sounds normal. Right breast exhibits no mass, no nipple discharge, no skin change and no tenderness. Left breast exhibits no mass, no nipple discharge, no skin change and no tenderness.  Abdominal: Soft. There is no tenderness. There is no guarding.  Musculoskeletal: Normal range of motion.  Neurological: She is alert and oriented to person, place, and time. No cranial nerve deficit.  Psychiatric: She has a normal mood and affect. Her behavior is normal.  Vitals reviewed.   Assessment/Plan: Encounter for annual routine gynecological examination  Screening for breast cancer - Pt to sched mammo 11/19 - Plan: MM SCREENING BREAST TOMO BILATERAL  Increased risk of breast cancer - Cont monthly SBE, yearly CBE and mammos. Pt declines scr br MRI. Cont Vit D supp.  - Plan: MM SCREENING BREAST TOMO BILATERAL  Type 2 diabetes mellitus without complication, without long-term current use of insulin (HCC) - Pt has Rx metformin BID. Diet/exercise/wt loss. Rechk labs 9/19.   Hyperlipidemia associated with type 2 diabetes mellitus ([place]) - Pt on lipitor and ACEI due  to DM. Has Rx. Rechk labs 9/19  Weight loss counseling, encounter for  PCOS (polycystic ovarian syndrome) - Diet/exercise/wt loss. F/u prn DUB.       GYN counsel breast self exam, mammography screening, adequate intake of calcium and vitamin D, diet and exercise     F/U  Return in about 1 year (around 07/07/2018).  Emmanuell Kantz B. Gerrell Tabet, PA-C 07/06/2017 8:46 AM

## 2017-11-12 ENCOUNTER — Ambulatory Visit: Payer: BLUE CROSS/BLUE SHIELD | Admitting: Primary Care

## 2017-11-12 ENCOUNTER — Encounter: Payer: Self-pay | Admitting: Primary Care

## 2017-11-12 VITALS — BP 124/78 | HR 95 | Temp 98.5°F | Ht 62.0 in | Wt 289.2 lb

## 2017-11-12 DIAGNOSIS — E119 Type 2 diabetes mellitus without complications: Secondary | ICD-10-CM | POA: Diagnosis not present

## 2017-11-12 DIAGNOSIS — Z23 Encounter for immunization: Secondary | ICD-10-CM

## 2017-11-12 DIAGNOSIS — E785 Hyperlipidemia, unspecified: Secondary | ICD-10-CM | POA: Insufficient documentation

## 2017-11-12 DIAGNOSIS — F419 Anxiety disorder, unspecified: Secondary | ICD-10-CM

## 2017-11-12 DIAGNOSIS — F411 Generalized anxiety disorder: Secondary | ICD-10-CM | POA: Insufficient documentation

## 2017-11-12 LAB — POCT GLYCOSYLATED HEMOGLOBIN (HGB A1C): Hemoglobin A1C: 6.7 % — AB (ref 4.0–5.6)

## 2017-11-12 NOTE — Patient Instructions (Signed)
Stop by the lab prior to leaving today. I will notify you of your results once received.   Schedule an eye exam as discussed.   You were provided with a pneumonia vaccination which will cover you for 5 years.   Make sure to eat a healthy diet with plenty of vegetables, fruit, whole grains, lean protein.  Ensure you are consuming 64 ounces of water daily.  Please schedule a follow up appointment in 6 months for diabetes check.   It was a pleasure to meet you today! Please don't hesitate to call or message me with any questions. Welcome to [facility]!   Diabetes Mellitus and Nutrition When you have diabetes (diabetes mellitus), it is very important to have healthy eating habits because your blood sugar (glucose) levels are greatly affected by what you eat and drink. Eating healthy foods in the appropriate amounts, at about the same times every day, can help you:  Control your blood glucose.  Lower your risk of heart disease.  Improve your blood pressure.  Reach or maintain a healthy weight.  Every person with diabetes is different, and each person has different needs for a meal plan. Your health care provider may recommend that you work with a diet and nutrition specialist (dietitian) to make a meal plan that is best for you. Your meal plan may vary depending on factors such as:  The calories you need.  The medicines you take.  Your weight.  Your blood glucose, blood pressure, and cholesterol levels.  Your activity level.  Other health conditions you have, such as heart or kidney disease.  How do carbohydrates affect me? Carbohydrates affect your blood glucose level more than any other type of food. Eating carbohydrates naturally increases the amount of glucose in your blood. Carbohydrate counting is a method for keeping track of how many carbohydrates you eat. Counting carbohydrates is important to keep your blood glucose at a healthy level, especially if you use insulin or  take certain oral diabetes medicines. It is important to know how many carbohydrates you can safely have in each meal. This is different for every person. Your dietitian can help you calculate how many carbohydrates you should have at each meal and for snack. Foods that contain carbohydrates include:  Bread, cereal, rice, pasta, and crackers.  Potatoes and corn.  Peas, beans, and lentils.  Milk and yogurt.  Fruit and juice.  Desserts, such as cakes, cookies, ice cream, and candy.  How does alcohol affect me? Alcohol can cause a sudden decrease in blood glucose (hypoglycemia), especially if you use insulin or take certain oral diabetes medicines. Hypoglycemia can be a life-threatening condition. Symptoms of hypoglycemia (sleepiness, dizziness, and confusion) are similar to symptoms of having too much alcohol. If your health care provider says that alcohol is safe for you, follow these guidelines:  Limit alcohol intake to no more than 1 drink per day for nonpregnant women and 2 drinks per day for men. One drink equals 12 oz of beer, 5 oz of wine, or 1 oz of hard liquor.  Do not drink on an empty stomach.  Keep yourself hydrated with water, diet soda, or unsweetened iced tea.  Keep in mind that regular soda, juice, and other mixers may contain a lot of sugar and must be counted as carbohydrates.  What are tips for following this plan? Reading food labels  Start by checking the serving size on the label. The amount of calories, carbohydrates, fats, and other nutrients listed on the  label are based on one serving of the food. Many foods contain more than one serving per package.  Check the total grams (g) of carbohydrates in one serving. You can calculate the number of servings of carbohydrates in one serving by dividing the total carbohydrates by 15. For example, if a food has 30 g of total carbohydrates, it would be equal to 2 servings of carbohydrates.  Check the number of grams (g)  of saturated and trans fats in one serving. Choose foods that have low or no amount of these fats.  Check the number of milligrams (mg) of sodium in one serving. Most people should limit total sodium intake to less than 2,300 mg per day.  Always check the nutrition information of foods labeled as "low-fat" or "nonfat". These foods may be higher in added sugar or refined carbohydrates and should be avoided.  Talk to your dietitian to identify your daily goals for nutrients listed on the label. Shopping  Avoid buying canned, premade, or processed foods. These foods tend to be high in fat, sodium, and added sugar.  Shop around the outside edge of the grocery store. This includes fresh fruits and vegetables, bulk grains, fresh meats, and fresh dairy. Cooking  Use low-heat cooking methods, such as baking, instead of high-heat cooking methods like deep frying.  Cook using healthy oils, such as olive, canola, or sunflower oil.  Avoid cooking with butter, cream, or high-fat meats. Meal planning  Eat meals and snacks regularly, preferably at the same times every day. Avoid going long periods of time without eating.  Eat foods high in fiber, such as fresh fruits, vegetables, beans, and whole grains. Talk to your dietitian about how many servings of carbohydrates you can eat at each meal.  Eat 4-6 ounces of lean protein each day, such as lean meat, chicken, fish, eggs, or tofu. 1 ounce is equal to 1 ounce of meat, chicken, or fish, 1 egg, or 1/4 cup of tofu.  Eat some foods each day that contain healthy fats, such as avocado, nuts, seeds, and fish. Lifestyle   Check your blood glucose regularly.  Exercise at least 30 minutes 5 or more days each week, or as told by your health care provider.  Take medicines as told by your health care provider.  Do not use any products that contain nicotine or tobacco, such as cigarettes and e-cigarettes. If you need help quitting, ask your health care  provider.  Work with a Veterinary surgeoncounselor or diabetes educator to identify strategies to manage stress and any emotional and social challenges. What are some questions to ask my health care provider?  Do I need to meet with a diabetes educator?  Do I need to meet with a dietitian?  What number can I call if I have questions?  When are the best times to check my blood glucose? Where to find more information:  American Diabetes Association: diabetes.org/food-and-fitness/food  Academy of Nutrition and Dietetics: https://www.vargas.com/www.eatright.org/resources/health/diseases-and-conditions/diabetes  [employer] Institute of Diabetes and Digestive and Kidney Diseases (NIH): FindJewelers.czwww.niddk.nih.gov/health-information/diabetes/overview/diet-eating-physical-activity Summary  A healthy meal plan will help you control your blood glucose and maintain a healthy lifestyle.  Working with a diet and nutrition specialist (dietitian) can help you make a meal plan that is best for you.  Keep in mind that carbohydrates and alcohol have immediate effects on your blood glucose levels. It is important to count carbohydrates and to use alcohol carefully. This information is not intended to replace advice given to you by your health care  provider. Make sure you discuss any questions you have with your health care provider. Document Released: 01/01/2005 Document Revised: 05/11/2016 Document Reviewed: 05/11/2016 Elsevier Interactive Patient Education  [employer].

## 2017-11-12 NOTE — Assessment & Plan Note (Signed)
Compliant to atorvastatin 10 mg, recent lipid panel with LDL at goal. Continue same.

## 2017-11-12 NOTE — Assessment & Plan Note (Signed)
A1C of 6.6 in March 2019, repeat A1C pending. Managed on ACE and statin. Foot exam today.  Pneumonia vaccination provided today. Recommended she schedule an eye exam.   Discussed the importance of a healthy diet and regular exercise in order for weight loss, and to reduce the risk of any potential medical problems. Handout provided.   Follow up in 6 months.

## 2017-11-12 NOTE — Assessment & Plan Note (Signed)
Infrequent, using Xanax very sparingly.  Discussed and discouraged use of this medication if possible. Continue to monitor.

## 2017-11-12 NOTE — Progress Notes (Signed)
Subjective:    Patient ID: Jaime Gilmore, female    DOB: [birth year], 42 y.o.   MRN: [id number]  HPI  [name] is a 42 year old female who presents today to establish care and discuss the problems mentioned below. Will review old records. She follows with GYN annually.  1) Type 2 Diabetes: Currently managed on metformin 500 mg BID, also on lisinopril 5 mg for renal protection. Her last A1C was 6.6 in March 2019.  Diet currently consists of:  Breakfast: Fast food Lunch: Sandwiches Dinner: Meat, vegetable, starch, take out food Beverages: Diet Coke, little water  Exercise: No exercise   2) Hyperlipidemia: Currently managed on atorvastatin 10 mg. Her last lipid panel was in March 2019 with LDL of 69.   3) Panic Attacks: Infrequent. Has a bottle of Xanax that was prescribed over one year ago, still has half of the bottle. She will only take this as needed for extreme anxiety. She denies daily anxiety.  Review of Systems  Eyes: Negative for visual disturbance.  Respiratory: Negative for shortness of breath.   Cardiovascular: Negative for chest pain.  Neurological: Negative for dizziness and numbness.       Past Medical History:  Diagnosis Date  . Anxiety   . BRCA gene mutation negative in female 06/2015  . Cervical high risk HPV (human papillomavirus) test positive 05/15/09;05/22/10;12/16/10;2014   hpv pos  . Dysmenorrhea   . Family history of breast cancer 06/2015  . History of mammogram 02/19/2015   BIRAD I  . History of Papanicolaou smear of cervix 06/25/2015   RNIL;NEG  . Hyperlipidemia   . Increased risk of breast cancer 08/2015   IBIS=24%  . Insomnia   . Onychomycosis 06/12/2014  . Pap smear abnormality of cervix with ASCUS favoring dysplasia 05/22/10;12/16/10;11/27/11;05/30/12  . Pap smear abnormality of cervix with LGSIL 05/15/2009  . Polycystic ovaries   . Type 2 diabetes mellitus ([place])   . Vitamin D deficiency      Social History   Socioeconomic History    . Marital status: Married    Spouse name: Not on file  . Number of children: 0  . Years of education: 9  . Highest education level: Not on file  Occupational History  . Occupation: business  Social Needs  . Financial resource strain: Not on file  . Food insecurity:    Worry: Not on file    Inability: Not on file  . Transportation needs:    Medical: Not on file    Non-medical: Not on file  Tobacco Use  . Smoking status: Current Every Day Smoker    Packs/day: 1.00  . Smokeless tobacco: Never Used  Substance and Sexual Activity  . Alcohol use: No  . Drug use: No  . Sexual activity: Yes    Birth control/protection: Condom  Lifestyle  . Physical activity:    Days per week: Not on file    Minutes per session: Not on file  . Stress: Not on file  Relationships  . Social connections:    Talks on phone: Not on file    Gets together: Not on file    Attends religious service: Not on file    Active member of club or organization: Not on file    Attends meetings of clubs or organizations: Not on file    Relationship status: Not on file  . Intimate partner violence:    Fear of current or ex partner: Not on file    Emotionally  abused: Not on file    Physically abused: Not on file    Forced sexual activity: Not on file  Other Topics Concern  . Not on file  Social History Narrative   Married.   No children.    Works in [place].   Enjoys watching movies, swimming.     Past Surgical History:  Procedure Laterality Date  . CERVICAL BIOPSY  W/ LOOP ELECTRODE EXCISION  06/2012   PATH NEG  . COLPOSCOPY  05/15/09;2/17;12/3; 5/13   NO LESIONS  . INTRAUTERINE DEVICE (IUD) INSERTION  08/2007  . IUD REMOVAL  05/30/2012  . LAPAROSCOPIC CHOLECYSTECTOMY  2004  . SKIN LESION EXCISION  2007   DERMATOFIBROMA    Family History  Problem Relation Age of Onset  . Breast cancer Maternal Aunt 78  . Breast cancer Mother 44  . Anemia Mother   . Hypertension Father   . Cancer Maternal Uncle    . Diabetes Maternal Grandmother   . Hypertension Paternal Grandmother   . Cancer Cousin     No Known Allergies  Current Outpatient Medications on File Prior to Visit  Medication Sig Dispense Refill  . ALPRAZolam (XANAX) 0.5 MG tablet Take 0.5 mg by mouth at bedtime as needed for anxiety (sxs).    Marland Kitchen atorvastatin (LIPITOR) 10 MG tablet Take 1 tablet (10 mg total) by mouth daily. 90 tablet 1  . Cholecalciferol (VITAMIN D3) 5000 units TABS Take by mouth.    . Fluticasone-Sodium Chloride 50-2.7 MCG/ACT-% THPK Place 1 spray into the nose 2 (two) times daily. 1 each 5  . lisinopril (PRINIVIL) 5 MG tablet Take 1 tablet daily 90 tablet 1  . metFORMIN (GLUCOPHAGE) 500 MG tablet Take 1 tablet (500 mg total) by mouth 2 (two) times daily with a meal. 180 tablet 1   No current facility-administered medications on file prior to visit.     BP 124/78 (BP Location: Left Arm, Patient Position: Sitting, Cuff Size: Large)   Pulse 95   Temp 98.5 F (36.9 C) (Oral)   Ht 5' 2"  (1.575 m)   Wt 289 lb 4 oz (131.2 kg)   LMP 10/10/2017   SpO2 100%   BMI 52.90 kg/m    Objective:   Physical Exam  Constitutional: She appears well-nourished.  Neck: Neck supple.  Cardiovascular: Normal rate and regular rhythm.  Respiratory: Effort normal and breath sounds normal.  Skin: Skin is warm and dry.  Psychiatric: She has a normal mood and affect.           Assessment & Plan:

## 2017-11-22 ENCOUNTER — Encounter: Payer: Self-pay | Admitting: Primary Care

## 2018-01-27 DIAGNOSIS — Z23 Encounter for immunization: Secondary | ICD-10-CM | POA: Diagnosis not present

## 2018-03-08 ENCOUNTER — Other Ambulatory Visit: Payer: Self-pay | Admitting: Obstetrics and Gynecology

## 2018-03-08 ENCOUNTER — Encounter: Payer: Self-pay | Admitting: Obstetrics and Gynecology

## 2018-03-08 DIAGNOSIS — E119 Type 2 diabetes mellitus without complications: Secondary | ICD-10-CM

## 2018-03-08 NOTE — Telephone Encounter (Signed)
Please advise 

## 2018-03-09 ENCOUNTER — Encounter: Payer: Self-pay | Admitting: Obstetrics and Gynecology

## 2018-03-09 ENCOUNTER — Ambulatory Visit
Admission: RE | Admit: 2018-03-09 | Discharge: 2018-03-09 | Disposition: A | Discharge status: Home / Self Care | Payer: BLUE CROSS/BLUE SHIELD | Source: Ambulatory Visit | Attending: Obstetrics and Gynecology | Admitting: Obstetrics and Gynecology

## 2018-03-09 DIAGNOSIS — Z9189 Other specified personal risk factors, not elsewhere classified: Secondary | ICD-10-CM | POA: Diagnosis not present

## 2018-03-09 DIAGNOSIS — Z1239 Encounter for other screening for malignant neoplasm of breast: Secondary | ICD-10-CM | POA: Diagnosis not present

## 2018-03-09 DIAGNOSIS — Z1231 Encounter for screening mammogram for malignant neoplasm of breast: Secondary | ICD-10-CM | POA: Diagnosis not present

## 2018-03-11 ENCOUNTER — Other Ambulatory Visit: Payer: BLUE CROSS/BLUE SHIELD

## 2018-03-11 DIAGNOSIS — E1169 Type 2 diabetes mellitus with other specified complication: Secondary | ICD-10-CM

## 2018-03-11 DIAGNOSIS — E119 Type 2 diabetes mellitus without complications: Secondary | ICD-10-CM

## 2018-03-11 DIAGNOSIS — Z1321 Encounter for screening for nutritional disorder: Secondary | ICD-10-CM

## 2018-03-11 DIAGNOSIS — E785 Hyperlipidemia, unspecified: Secondary | ICD-10-CM

## 2018-03-12 LAB — COMPREHENSIVE METABOLIC PANEL
A/G RATIO: 1.6 (ref 1.2–2.2)
ALT: 15 IU/L (ref 0–32)
AST: 9 IU/L (ref 0–40)
Albumin: 4 g/dL (ref 3.5–5.5)
Alkaline Phosphatase: 84 IU/L (ref 39–117)
BUN/Creatinine Ratio: 15 (ref 9–23)
BUN: 10 mg/dL (ref 6–24)
CALCIUM: 9 mg/dL (ref 8.7–10.2)
CO2: 19 mmol/L — ABNORMAL LOW (ref 20–29)
Chloride: 104 mmol/L (ref 96–106)
Creatinine, Ser: 0.65 mg/dL (ref 0.57–1.00)
GFR, EST AFRICAN AMERICAN: 125 mL/min/{1.73_m2} (ref >59)
GFR, EST NON AFRICAN AMERICAN: 108 mL/min/{1.73_m2} (ref >59)
GLOBULIN, TOTAL: 2.5 g/dL (ref 1.5–4.5)
Glucose: 113 mg/dL — ABNORMAL HIGH (ref 65–99)
POTASSIUM: 4.9 mmol/L (ref 3.5–5.2)
SODIUM: 139 mmol/L (ref 134–144)
TOTAL PROTEIN: 6.5 g/dL (ref 6.0–8.5)

## 2018-03-12 LAB — VITAMIN D 25 HYDROXY (VIT D DEFICIENCY, FRACTURES): VIT D 25 HYDROXY: 26.2 ng/mL — AB (ref 30.0–100.0)

## 2018-03-12 LAB — LIPID PANEL
CHOLESTEROL TOTAL: 122 mg/dL (ref 100–199)
Chol/HDL Ratio: 3.1 ratio (ref 0.0–4.4)
HDL: 40 mg/dL (ref >39)
LDL CALC: 47 mg/dL (ref 0–99)
TRIGLYCERIDES: 176 mg/dL — AB (ref 0–149)
VLDL Cholesterol Cal: 35 mg/dL (ref 5–40)

## 2018-03-12 LAB — HEMOGLOBIN A1C
Est. average glucose Bld gHb Est-mCnc: 146 mg/dL
HEMOGLOBIN A1C: 6.7 % — AB (ref 4.8–5.6)

## 2018-03-14 ENCOUNTER — Telehealth: Payer: Self-pay | Admitting: Obstetrics and Gynecology

## 2018-03-14 DIAGNOSIS — E1169 Type 2 diabetes mellitus with other specified complication: Secondary | ICD-10-CM

## 2018-03-14 DIAGNOSIS — E119 Type 2 diabetes mellitus without complications: Secondary | ICD-10-CM

## 2018-03-14 DIAGNOSIS — E785 Hyperlipidemia, unspecified: Principal | ICD-10-CM

## 2018-03-14 MED ORDER — LISINOPRIL 5 MG PO TABS: ORAL_TABLET | ORAL | 1 refills | Status: DC

## 2018-03-14 MED ORDER — METFORMIN HCL 500 MG PO TABS: 500.0000 mg | ORAL_TABLET | Freq: Two times a day (BID) | ORAL | 1 refills | Status: DC

## 2018-03-14 MED ORDER — ATORVASTATIN CALCIUM 10 MG PO TABS: 10.0000 mg | ORAL_TABLET | Freq: Every day | ORAL | 1 refills | Status: DC

## 2018-03-14 NOTE — Telephone Encounter (Signed)
Pt aware of labs. Rechk due at 3/20 annual. Decreased rice to decrease TGs. Add Vit D3 5000 IU daily. Rx RF metformin, lisinopril, atorvastatin.   Current Outpatient Medications:  .  ALPRAZolam (XANAX) 0.5 MG tablet, Take 0.5 mg by mouth at bedtime as needed for anxiety (sxs)., Disp: , Rfl:  .  atorvastatin (LIPITOR) 10 MG tablet, Take 1 tablet (10 mg total) by mouth daily., Disp: 90 tablet, Rfl: 1 .  Cholecalciferol (VITAMIN D3) 5000 units TABS, Take by mouth., Disp: , Rfl:  .  Fluticasone-Sodium Chloride 50-2.7 MCG/ACT-% THPK, Place 1 spray into the nose 2 (two) times daily., Disp: 1 each, Rfl: 5 .  lisinopril (PRINIVIL) 5 MG tablet, Take 1 tablet daily, Disp: 90 tablet, Rfl: 1 .  metFORMIN (GLUCOPHAGE) 500 MG tablet, Take 1 tablet (500 mg total) by mouth 2 (two) times daily with a meal., Disp: 180 tablet, Rfl: 1

## 2018-04-27 ENCOUNTER — Encounter: Payer: Self-pay | Admitting: Primary Care

## 2018-04-27 ENCOUNTER — Ambulatory Visit: Payer: BLUE CROSS/BLUE SHIELD | Admitting: Primary Care

## 2018-04-27 VITALS — BP 124/80 | HR 85 | Temp 98.1°F | Ht 62.0 in | Wt 284.0 lb

## 2018-04-27 DIAGNOSIS — R1012 Left upper quadrant pain: Secondary | ICD-10-CM | POA: Diagnosis not present

## 2018-04-27 DIAGNOSIS — K219 Gastro-esophageal reflux disease without esophagitis: Secondary | ICD-10-CM | POA: Diagnosis not present

## 2018-04-27 DIAGNOSIS — F419 Anxiety disorder, unspecified: Secondary | ICD-10-CM | POA: Diagnosis not present

## 2018-04-27 DIAGNOSIS — J309 Allergic rhinitis, unspecified: Secondary | ICD-10-CM | POA: Diagnosis not present

## 2018-04-27 DIAGNOSIS — E119 Type 2 diabetes mellitus without complications: Secondary | ICD-10-CM

## 2018-04-27 MED ORDER — ESCITALOPRAM OXALATE 10 MG PO TABS
10.0000 mg | ORAL_TABLET | Freq: Every day | ORAL | 1 refills | Status: DC
Start: 2018-04-27 — End: 2018-06-07

## 2018-04-27 MED ORDER — FLUTICASONE PROPIONATE 50 MCG/ACT NA SUSP: 1.0000 | Freq: Two times a day (BID) | NASAL | 0 refills | Status: DC

## 2018-04-27 MED ORDER — FAMOTIDINE 20 MG PO TABS: 20.0000 mg | ORAL_TABLET | Freq: Two times a day (BID) | ORAL | 0 refills | Status: DC

## 2018-04-27 NOTE — Assessment & Plan Note (Signed)
Chronic and did well on Zantac before it was recalled. Rx for Pepcid 20 mg BID sent to pharmacy.  Will do some testing for H pylori given LUQ abdominal symptoms. Hold off on PPI treatment until stool specimen has returned. Exam overall stable.

## 2018-04-27 NOTE — Assessment & Plan Note (Signed)
A1C in November 2019 stable.  Followed by GYN.  Managed on statin and ACE. Discussed to schedule an eye exam. Foot exam UTD.  Follow up with GYN as requested.

## 2018-04-27 NOTE — Assessment & Plan Note (Signed)
Refilled Flonase

## 2018-04-27 NOTE — Progress Notes (Signed)
Subjective:    Patient ID: Jaime Gilmore, female    DOB: 09-13-1975, 43 y.o.   MRN: [id number]  HPI  Jaime Gilmore is a 43 year old female who presents today for follow up of diabetes. She is actually followed by her GYN for diabetes, this information was noted today. She also has a few things to discuss.  1) Type 2 Diabetes:   Current medications include: Metformin 500 mg BID  Last A1C: 6.7 in November 2019 Last Eye Exam: Did not complete in 2019 Last Foot Exam: Completed in July 2019 Pneumonia Vaccination: Completed in 2019 ACE/ARB: Lisinopril Statin: atorvastatin   Diet currently consists of:  Breakfast: Fast food, frozen burritos  Lunch: Fast food, sandwich Dinner: Meat, vegetable, take out food Snacks: Candy Desserts: 4-5 days weekly Beverages: Coffee, diet soda, water  Exercise: She is not exercising    BP Readings from Last 3 Encounters:  04/27/18 124/80  11/12/17 124/78  07/06/17 122/88    Wt Readings from Last 3 Encounters:  04/27/18 284 lb (128.8 kg)  11/12/17 289 lb 4 oz (131.2 kg)  07/06/17 280 lb (127 kg)   2) GERD: Chronic. Once managed on Zantac 150 once daily until it was recently recalled. She experiences symptoms of esophageal burning, belching, left upper abdominal pain. Her left upper abdominal pain is chronic and intermittent, feels like burning and can be very uncomfortable at times. She's currently taking Tagamet with improvement in symptoms. She tried Pepcid 20 mg in the past without much improvement. She is requesting medication to help.  3) GAD: Chronic and originally diagnosed years ago. Symptoms of daily worry, thinking worst case scenario, feeling anxious/nervous, mind racing thoughts. A lot of her anxiety comes from both work and home life. Symptoms have been present for the past one year, worse recently. She has a prescription for Xanax for which she uses infrequently. GAD 7 score of 16 today.  She was once managed on Lexapro years ago,  thinks she did very well and was able to come off. She did well on her own with anxiety for years.   Review of Systems  Constitutional: Negative for fever.  Respiratory: Negative for shortness of breath.   Cardiovascular: Negative for chest pain.  Gastrointestinal: Positive for abdominal pain. Negative for constipation, nausea and vomiting.       GERD  Psychiatric/Behavioral: The patient is nervous/anxious.        See HPI       Past Medical History:  Diagnosis Date  . Anxiety   . BRCA gene mutation negative in female 06/2015  . Cervical high risk HPV (human papillomavirus) test positive 05/15/09;05/22/10;12/16/10;2014   hpv pos  . Dysmenorrhea   . Family history of breast cancer 06/2015  . History of mammogram 02/19/2015   BIRAD I  . History of Papanicolaou smear of cervix 06/25/2015   RNIL;NEG  . Hyperlipidemia   . Increased risk of breast cancer 08/2015   IBIS=24%  . Insomnia   . Onychomycosis 06/12/2014  . Pap smear abnormality of cervix with ASCUS favoring dysplasia 05/22/10;12/16/10;11/27/11;05/30/12  . Pap smear abnormality of cervix with LGSIL 05/15/2009  . Polycystic ovaries   . Type 2 diabetes mellitus ([place])   . Vitamin D deficiency      Social History   Socioeconomic History  . Marital status: Married    Spouse name: Not on file  . Number of children: 0  . Years of education: 76  . Highest education level: Not on file  Occupational History  . Occupation: business  Social Needs  . Financial resource strain: Not on file  . Food insecurity:    Worry: Not on file    Inability: Not on file  . Transportation needs:    Medical: Not on file    Non-medical: Not on file  Tobacco Use  . Smoking status: Current Every Day Smoker    Packs/day: 1.00  . Smokeless tobacco: Never Used  Substance and Sexual Activity  . Alcohol use: No  . Drug use: No  . Sexual activity: Yes    Birth control/protection: Condom  Lifestyle  . Physical activity:    Days per week: Not on  file    Minutes per session: Not on file  . Stress: Not on file  Relationships  . Social connections:    Talks on phone: Not on file    Gets together: Not on file    Attends religious service: Not on file    Active member of club or organization: Not on file    Attends meetings of clubs or organizations: Not on file    Relationship status: Not on file  . Intimate partner violence:    Fear of current or ex partner: Not on file    Emotionally abused: Not on file    Physically abused: Not on file    Forced sexual activity: Not on file  Other Topics Concern  . Not on file  Social History Narrative   Married.   No children.    Works in [place].   Enjoys watching movies, swimming.     Past Surgical History:  Procedure Laterality Date  . CERVICAL BIOPSY  W/ LOOP ELECTRODE EXCISION  06/2012   PATH NEG  . COLPOSCOPY  05/15/09;2/17;12/3; 5/13   NO LESIONS  . INTRAUTERINE DEVICE (IUD) INSERTION  08/2007  . IUD REMOVAL  05/30/2012  . LAPAROSCOPIC CHOLECYSTECTOMY  2004  . SKIN LESION EXCISION  2007   DERMATOFIBROMA    Family History  Problem Relation Age of Onset  . Breast cancer Maternal Aunt 41  . Breast cancer Mother 51  . Anemia Mother   . High Cholesterol Mother   . Hypertension Father   . Cancer Maternal Uncle   . Diabetes Maternal Grandmother   . Hypertension Paternal Grandmother   . Hearing loss Paternal Grandmother   . Heart disease Paternal Grandmother   . High Cholesterol Paternal Grandmother   . Cancer Cousin   . Hearing loss Paternal Grandfather   . Early death Paternal Grandfather     No Known Allergies  Current Outpatient Medications on File Prior to Visit  Medication Sig Dispense Refill  . ALPRAZolam (XANAX) 0.5 MG tablet Take 0.5 mg by mouth at bedtime as needed for anxiety (sxs).    Marland Kitchen atorvastatin (LIPITOR) 10 MG tablet Take 1 tablet (10 mg total) by mouth daily. 90 tablet 1  . Fluticasone-Sodium Chloride 50-2.7 MCG/ACT-% THPK Place 1 spray into the nose 2  (two) times daily. 1 each 5  . lisinopril (PRINIVIL) 5 MG tablet Take 1 tablet daily 90 tablet 1  . metFORMIN (GLUCOPHAGE) 500 MG tablet Take 1 tablet (500 mg total) by mouth 2 (two) times daily with a meal. 180 tablet 1   No current facility-administered medications on file prior to visit.     BP 124/80   Pulse 85   Temp 98.1 F (36.7 C) (Oral)   Ht _0  (1.575 m)   Wt 284 lb (128.8 kg)   LMP  04/04/2018   SpO2 98%   BMI 51.94 kg/m    Objective:   Physical Exam  Constitutional: She appears well-nourished.  Neck: Neck supple.  Cardiovascular: Normal rate and regular rhythm.  Respiratory: Effort normal and breath sounds normal.  GI: Soft. Bowel sounds are normal. There is no abdominal tenderness.  Skin: Skin is warm and dry.  Psychiatric: She has a normal mood and affect.           Assessment & Plan:

## 2018-04-27 NOTE — Assessment & Plan Note (Signed)
Chronic and intermittent for years, worse over last one year. GAD 7 score of 16 today. Discouraged use of Xanax. Discussed options for treatment, she opts for medication.  Rx for Lexapro 10 mg sent to pharmacy. Patient is to take 1/2 tablet daily for 6 days, then advance to 1 full tablet thereafter. We discussed possible side effects of headache, GI upset, drowsiness, and SI/HI. If thoughts of SI/HI develop, we discussed to present to the emergency immediately. Patient verbalized understanding.   Follow up in 6 weeks for re-evaluation.

## 2018-04-27 NOTE — Patient Instructions (Signed)
Stop by the lab prior to leaving today. Return your stool specimen as directed. I will notify you of your results once received.   Start escitalopram (Lexapro) 10 mg tablets for anxiety. Start by taking 1/2 tablet daily for 6 days, then increase to 1 full tablet thereafter.  You may take famotidine (Pepcid) 20 mg for heartburn. Take 2 tablets once daily or 1 tablet twice daily. We may switch you to omeprazole once your stool lab returns.  Schedule a follow up visit in 6 weeks for evaluation of anxiety.  It was a pleasure to see you today!

## 2018-04-28 NOTE — Addendum Note (Signed)
Addended by: Alvina Chou on: 04/28/2018 11:41 AM   Modules accepted: Orders

## 2018-05-20 ENCOUNTER — Other Ambulatory Visit: Payer: Self-pay | Admitting: Primary Care

## 2018-05-20 DIAGNOSIS — F419 Anxiety disorder, unspecified: Secondary | ICD-10-CM

## 2018-05-23 ENCOUNTER — Other Ambulatory Visit: Payer: Self-pay | Admitting: Primary Care

## 2018-05-23 DIAGNOSIS — J309 Allergic rhinitis, unspecified: Secondary | ICD-10-CM

## 2018-06-07 DIAGNOSIS — F411 Generalized anxiety disorder: Secondary | ICD-10-CM

## 2018-06-07 MED ORDER — ESCITALOPRAM OXALATE 20 MG PO TABS: 20.0000 mg | ORAL_TABLET | Freq: Every day | ORAL | 0 refills | Status: DC

## 2018-06-07 MED ORDER — ALPRAZOLAM 0.5 MG PO TABS: 0.5000 mg | ORAL_TABLET | Freq: Every day | ORAL | 0 refills | Status: DC | PRN

## 2018-06-23 ENCOUNTER — Other Ambulatory Visit: Payer: Self-pay | Admitting: Primary Care

## 2018-06-23 ENCOUNTER — Encounter: Payer: Self-pay | Admitting: Primary Care

## 2018-06-23 ENCOUNTER — Ambulatory Visit: Payer: BLUE CROSS/BLUE SHIELD | Admitting: Primary Care

## 2018-06-23 DIAGNOSIS — F411 Generalized anxiety disorder: Secondary | ICD-10-CM

## 2018-06-23 DIAGNOSIS — F419 Anxiety disorder, unspecified: Secondary | ICD-10-CM

## 2018-06-23 DIAGNOSIS — K219 Gastro-esophageal reflux disease without esophagitis: Secondary | ICD-10-CM | POA: Diagnosis not present

## 2018-06-23 MED ORDER — ESCITALOPRAM OXALATE 20 MG PO TABS: 20.0000 mg | ORAL_TABLET | Freq: Every day | ORAL | 2 refills | Status: DC

## 2018-06-23 NOTE — Assessment & Plan Note (Signed)
Improved on Lexapro 20 mg and is doing well at the increased dose. Denies SI/HI. Continue same. Refills sent to pharmacy.

## 2018-06-23 NOTE — Patient Instructions (Signed)
Continue Lexapro 20 mg daily for anxiety.  It was a pleasure to see you today!

## 2018-06-23 NOTE — Progress Notes (Signed)
Subjective:    Patient ID: Jaime Gilmore, female    DOB: July 22, 1975, 43 y.o.   MRN: [id number]  HPI  Jaime Gilmore is a 43 year old female who presents today for follow up of anxiety.   She was last evaluated on 04/27/18 with reports of chronic and intermittent anxiety for years. Recent symptoms include daily worry, thinking worst case scenario, feeling anxious, mind racing thoughts. She is under a lot of home and work stress. GAD 7 score was 16 so Lexapro 10 mg was initiated. One month later she sent a message requesting a dose increase as the medication was helpful but didn't feel as though it was enough. She was increased to 20 mg.   Since her last visit she's feeling much better. Positive effects include feeling less anxious, less worry, able to handle stressful situations better. She denies SI/HI, GI upset.   She is also doing much better on famotidine for GERD and abdominal pain.   Review of Systems  Respiratory: Negative for shortness of breath.   Cardiovascular: Negative for chest pain.  Gastrointestinal: Negative for abdominal pain and nausea.  Neurological: Negative for headaches.  Psychiatric/Behavioral: The patient is not nervous/anxious.        Past Medical History:  Diagnosis Date  . Anxiety   . BRCA gene mutation negative in female 06/2015  . Cervical high risk HPV (human papillomavirus) test positive 05/15/09;05/22/10;12/16/10;2014   hpv pos  . Dysmenorrhea   . Family history of breast cancer 06/2015  . History of mammogram 02/19/2015   BIRAD I  . History of Papanicolaou smear of cervix 06/25/2015   RNIL;NEG  . Hyperlipidemia   . Increased risk of breast cancer 08/2015   IBIS=24%  . Insomnia   . Onychomycosis 06/12/2014  . Pap smear abnormality of cervix with ASCUS favoring dysplasia 05/22/10;12/16/10;11/27/11;05/30/12  . Pap smear abnormality of cervix with LGSIL 05/15/2009  . Polycystic ovaries   . Type 2 diabetes mellitus ([place])   . Vitamin D deficiency        Social History   Socioeconomic History  . Marital status: Married    Spouse name: Not on file  . Number of children: 0  . Years of education: 41  . Highest education level: Not on file  Occupational History  . Occupation: business  Social Needs  . Financial resource strain: Not on file  . Food insecurity:    Worry: Not on file    Inability: Not on file  . Transportation needs:    Medical: Not on file    Non-medical: Not on file  Tobacco Use  . Smoking status: Current Every Day Smoker    Packs/day: 1.00  . Smokeless tobacco: Never Used  Substance and Sexual Activity  . Alcohol use: No  . Drug use: No  . Sexual activity: Yes    Birth control/protection: Condom  Lifestyle  . Physical activity:    Days per week: Not on file    Minutes per session: Not on file  . Stress: Not on file  Relationships  . Social connections:    Talks on phone: Not on file    Gets together: Not on file    Attends religious service: Not on file    Active member of club or organization: Not on file    Attends meetings of clubs or organizations: Not on file    Relationship status: Not on file  . Intimate partner violence:    Fear of current or ex partner:  Not on file    Emotionally abused: Not on file    Physically abused: Not on file    Forced sexual activity: Not on file  Other Topics Concern  . Not on file  Social History Narrative   Married.   No children.    Works in [place].   Enjoys watching movies, swimming.     Past Surgical History:  Procedure Laterality Date  . CERVICAL BIOPSY  W/ LOOP ELECTRODE EXCISION  06/2012   PATH NEG  . COLPOSCOPY  05/15/09;2/17;12/3; 5/13   NO LESIONS  . INTRAUTERINE DEVICE (IUD) INSERTION  08/2007  . IUD REMOVAL  05/30/2012  . LAPAROSCOPIC CHOLECYSTECTOMY  2004  . SKIN LESION EXCISION  2007   DERMATOFIBROMA    Family History  Problem Relation Age of Onset  . Breast cancer Maternal Aunt 26  . Breast cancer Mother 50  . Anemia Mother   . High  Cholesterol Mother   . Hypertension Father   . Cancer Maternal Uncle   . Diabetes Maternal Grandmother   . Hypertension Paternal Grandmother   . Hearing loss Paternal Grandmother   . Heart disease Paternal Grandmother   . High Cholesterol Paternal Grandmother   . Cancer Cousin   . Hearing loss Paternal Grandfather   . Early death Paternal Grandfather     No Known Allergies  Current Outpatient Medications on File Prior to Visit  Medication Sig Dispense Refill  . ALPRAZolam (XANAX) 0.5 MG tablet Take 1 tablet (0.5 mg total) by mouth daily as needed for anxiety. Use sparingly. 10 tablet 0  . atorvastatin (LIPITOR) 10 MG tablet Take 1 tablet (10 mg total) by mouth daily. 90 tablet 1  . famotidine (PEPCID) 20 MG tablet Take 1 tablet (20 mg total) by mouth 2 (two) times daily. For heartburn. 180 tablet 0  . fluticasone (FLONASE) 50 MCG/ACT nasal spray PLACE 1 SPRAY INTO BOTH NOSTRILS 2 (TWO) TIMES DAILY. 16 g 2  . Fluticasone-Sodium Chloride 50-2.7 MCG/ACT-% THPK Place 1 spray into the nose 2 (two) times daily. 1 each 5  . lisinopril (PRINIVIL) 5 MG tablet Take 1 tablet daily 90 tablet 1  . metFORMIN (GLUCOPHAGE) 500 MG tablet Take 1 tablet (500 mg total) by mouth 2 (two) times daily with a meal. 180 tablet 1   No current facility-administered medications on file prior to visit.     BP 122/82   Pulse 88   Temp 98 F (36.7 C) (Oral)   Ht 5' 2"  (1.575 m)   Wt 285 lb 12 oz (129.6 kg)   LMP 05/28/2018   SpO2 97%   BMI 52.26 kg/m    Objective:   Physical Exam  Constitutional: She appears well-nourished.  Neck: Neck supple.  Cardiovascular: Normal rate and regular rhythm.  Respiratory: Effort normal and breath sounds normal.  Skin: Skin is warm and dry.  Psychiatric: She has a normal mood and affect.           Assessment & Plan:

## 2018-06-23 NOTE — Assessment & Plan Note (Signed)
Improved with Pepcid 20 mg BID. Continue same.

## 2018-06-29 ENCOUNTER — Telehealth: Payer: Self-pay

## 2018-06-29 NOTE — Telephone Encounter (Signed)
[facility] Night - Client Nonclinical Telephone Record [facility] Medical Call Center Client [facility] Night - Client Client Site  [facility] [place] - Night Physician Vernona Rieger - NP Contact Type Call Who Is Calling Patient / Member / Family / Caregiver Caller Name Edyth Labbe Caller Phone Number [phone] Patient Name Jaime Gilmore Patient DOB Leotis Pain Call Type Message Only Information Provided Reason for Call Request for General Office Information Initial Comment Callers husband has the flu. Additional Comment Call Closed By: Osvaldo Human Transaction Date/Time: 06/29/2018 7:26:21 AM (ET)

## 2018-06-29 NOTE — Telephone Encounter (Signed)
Noted. Patient is currently being seen at [facility]

## 2018-06-29 NOTE — Telephone Encounter (Signed)
Per DPR, left detail message of Kate Clark's comments for patient to call back 

## 2018-07-28 ENCOUNTER — Other Ambulatory Visit: Payer: Self-pay | Admitting: Primary Care

## 2018-07-28 DIAGNOSIS — K219 Gastro-esophageal reflux disease without esophagitis: Secondary | ICD-10-CM

## 2018-09-19 ENCOUNTER — Other Ambulatory Visit: Payer: Self-pay | Admitting: Obstetrics and Gynecology

## 2018-09-19 ENCOUNTER — Telehealth: Payer: Self-pay

## 2018-09-19 DIAGNOSIS — E119 Type 2 diabetes mellitus without complications: Secondary | ICD-10-CM

## 2018-09-19 DIAGNOSIS — E1169 Type 2 diabetes mellitus with other specified complication: Secondary | ICD-10-CM

## 2018-09-19 MED ORDER — LISINOPRIL 5 MG PO TABS: ORAL_TABLET | ORAL | 0 refills | Status: DC

## 2018-09-19 MED ORDER — ATORVASTATIN CALCIUM 10 MG PO TABS: 10.0000 mg | ORAL_TABLET | Freq: Every day | ORAL | 0 refills | Status: DC

## 2018-09-19 NOTE — Telephone Encounter (Signed)
Pt is aware.  

## 2018-09-19 NOTE — Progress Notes (Signed)
Rx RF lisinopril and lipitor eRxd. Annual sched 7/20 and due for labs then.

## 2018-09-19 NOTE — Telephone Encounter (Signed)
Pt calling for refill of medication but doesn't know the name - thinks it starts c an 'a'.  [phone]  Called pt.  Needs refills on atorvastatin and lisinopril.  Pharm correct in chart.

## 2018-09-19 NOTE — Telephone Encounter (Signed)
Rx RF eRxd. Pls notify pt. Thx 

## 2018-10-03 ENCOUNTER — Other Ambulatory Visit: Payer: Self-pay | Admitting: Obstetrics and Gynecology

## 2018-10-03 DIAGNOSIS — K219 Gastro-esophageal reflux disease without esophagitis: Secondary | ICD-10-CM

## 2018-10-03 DIAGNOSIS — E119 Type 2 diabetes mellitus without complications: Secondary | ICD-10-CM

## 2018-10-03 MED ORDER — LISINOPRIL 5 MG PO TABS: ORAL_TABLET | ORAL | 0 refills | Status: DC

## 2018-10-03 MED ORDER — FAMOTIDINE 20 MG PO TABS: 20.0000 mg | ORAL_TABLET | Freq: Two times a day (BID) | ORAL | 1 refills | Status: DC

## 2018-10-03 NOTE — Telephone Encounter (Signed)
advise

## 2018-10-06 ENCOUNTER — Other Ambulatory Visit: Payer: Self-pay | Admitting: Obstetrics and Gynecology

## 2018-10-06 DIAGNOSIS — E119 Type 2 diabetes mellitus without complications: Secondary | ICD-10-CM

## 2018-10-06 NOTE — Telephone Encounter (Signed)
Please advise 

## 2018-10-12 ENCOUNTER — Other Ambulatory Visit: Payer: Self-pay | Admitting: Obstetrics and Gynecology

## 2018-10-12 DIAGNOSIS — E1169 Type 2 diabetes mellitus with other specified complication: Secondary | ICD-10-CM

## 2018-10-12 DIAGNOSIS — E119 Type 2 diabetes mellitus without complications: Secondary | ICD-10-CM

## 2018-10-17 ENCOUNTER — Encounter: Payer: Self-pay | Admitting: Obstetrics and Gynecology

## 2018-10-17 DIAGNOSIS — E785 Hyperlipidemia, unspecified: Secondary | ICD-10-CM

## 2018-10-17 DIAGNOSIS — E1169 Type 2 diabetes mellitus with other specified complication: Secondary | ICD-10-CM

## 2018-10-17 DIAGNOSIS — E119 Type 2 diabetes mellitus without complications: Secondary | ICD-10-CM

## 2018-10-18 ENCOUNTER — Telehealth: Payer: Self-pay | Admitting: Obstetrics and Gynecology

## 2018-10-18 NOTE — Telephone Encounter (Signed)
Called and left voice mail for patient to call back to be schedule °

## 2018-10-18 NOTE — Telephone Encounter (Signed)
Called and left voicemail for patient to call back to be schedule for Labs °

## 2018-10-19 ENCOUNTER — Ambulatory Visit: Payer: BC Managed Care – PPO | Admitting: Obstetrics and Gynecology

## 2018-10-19 ENCOUNTER — Other Ambulatory Visit: Payer: Self-pay

## 2018-10-19 ENCOUNTER — Other Ambulatory Visit: Payer: BC Managed Care – PPO

## 2018-10-19 DIAGNOSIS — E1169 Type 2 diabetes mellitus with other specified complication: Secondary | ICD-10-CM | POA: Diagnosis not present

## 2018-10-19 DIAGNOSIS — E785 Hyperlipidemia, unspecified: Secondary | ICD-10-CM

## 2018-10-19 DIAGNOSIS — E119 Type 2 diabetes mellitus without complications: Secondary | ICD-10-CM

## 2018-10-20 ENCOUNTER — Encounter: Payer: Self-pay | Admitting: Obstetrics and Gynecology

## 2018-10-20 LAB — HEMOGLOBIN A1C
Est. average glucose Bld gHb Est-mCnc: 163 mg/dL
Hgb A1c MFr Bld: 7.3 % — ABNORMAL HIGH (ref 4.8–5.6)

## 2018-10-20 LAB — COMPREHENSIVE METABOLIC PANEL
ALT: 17 IU/L (ref 0–32)
AST: 11 IU/L (ref 0–40)
Albumin/Globulin Ratio: 1.6 (ref 1.2–2.2)
Albumin: 4 g/dL (ref 3.8–4.8)
Alkaline Phosphatase: 92 IU/L (ref 39–117)
BUN/Creatinine Ratio: 14 (ref 9–23)
BUN: 10 mg/dL (ref 6–24)
Bilirubin Total: <0.2 mg/dL (ref 0.0–1.2)
CO2: 22 mmol/L (ref 20–29)
Calcium: 9.5 mg/dL (ref 8.7–10.2)
Chloride: 100 mmol/L (ref 96–106)
Creatinine, Ser: 0.74 mg/dL (ref 0.57–1.00)
GFR calc Af Amer: 113 mL/min/{1.73_m2} (ref >59)
GFR calc non Af Amer: 98 mL/min/{1.73_m2} (ref >59)
Globulin, Total: 2.5 g/dL (ref 1.5–4.5)
Glucose: 128 mg/dL — ABNORMAL HIGH (ref 65–99)
Potassium: 5.2 mmol/L (ref 3.5–5.2)
Sodium: 138 mmol/L (ref 134–144)
Total Protein: 6.5 g/dL (ref 6.0–8.5)

## 2018-10-20 LAB — MICROALBUMIN / CREATININE URINE RATIO
Creatinine, Urine: 58.8 mg/dL
Microalb/Creat Ratio: <5 mg/g creat (ref 0–29)
Microalbumin, Urine: <3 ug/mL

## 2018-10-20 LAB — LIPID PANEL
Chol/HDL Ratio: 3.9 ratio (ref 0.0–4.4)
Cholesterol, Total: 152 mg/dL (ref 100–199)
HDL: 39 mg/dL — ABNORMAL LOW (ref >39)
LDL Calculated: 80 mg/dL (ref 0–99)
Triglycerides: 165 mg/dL — ABNORMAL HIGH (ref 0–149)
VLDL Cholesterol Cal: 33 mg/dL (ref 5–40)

## 2018-11-16 ENCOUNTER — Other Ambulatory Visit: Payer: Self-pay

## 2018-11-16 ENCOUNTER — Other Ambulatory Visit (HOSPITAL_COMMUNITY)
Admission: RE | Admit: 2018-11-16 | Discharge: 2018-11-16 | Disposition: A | Discharge status: Home / Self Care | Payer: BLUE CROSS/BLUE SHIELD | Source: Ambulatory Visit | Attending: Obstetrics and Gynecology | Admitting: Obstetrics and Gynecology

## 2018-11-16 ENCOUNTER — Encounter: Payer: Self-pay | Admitting: Obstetrics and Gynecology

## 2018-11-16 ENCOUNTER — Ambulatory Visit (INDEPENDENT_AMBULATORY_CARE_PROVIDER_SITE_OTHER): Payer: BC Managed Care – PPO | Admitting: Obstetrics and Gynecology

## 2018-11-16 VITALS — BP 140/90 | Ht 61.0 in | Wt 288.6 lb

## 2018-11-16 DIAGNOSIS — Z8742 Personal history of other diseases of the female genital tract: Secondary | ICD-10-CM

## 2018-11-16 DIAGNOSIS — Z01419 Encounter for gynecological examination (general) (routine) without abnormal findings: Secondary | ICD-10-CM

## 2018-11-16 DIAGNOSIS — E282 Polycystic ovarian syndrome: Secondary | ICD-10-CM | POA: Insufficient documentation

## 2018-11-16 DIAGNOSIS — N938 Other specified abnormal uterine and vaginal bleeding: Secondary | ICD-10-CM

## 2018-11-16 DIAGNOSIS — Z1329 Encounter for screening for other suspected endocrine disorder: Secondary | ICD-10-CM

## 2018-11-16 DIAGNOSIS — Z124 Encounter for screening for malignant neoplasm of cervix: Secondary | ICD-10-CM

## 2018-11-16 DIAGNOSIS — Z1239 Encounter for other screening for malignant neoplasm of breast: Secondary | ICD-10-CM

## 2018-11-16 DIAGNOSIS — Z9189 Other specified personal risk factors, not elsewhere classified: Secondary | ICD-10-CM

## 2018-11-16 DIAGNOSIS — E1169 Type 2 diabetes mellitus with other specified complication: Secondary | ICD-10-CM

## 2018-11-16 DIAGNOSIS — F411 Generalized anxiety disorder: Secondary | ICD-10-CM

## 2018-11-16 DIAGNOSIS — Z1151 Encounter for screening for human papillomavirus (HPV): Secondary | ICD-10-CM

## 2018-11-16 DIAGNOSIS — E119 Type 2 diabetes mellitus without complications: Secondary | ICD-10-CM

## 2018-11-16 MED ORDER — ALPRAZOLAM 0.5 MG PO TABS: 0.5000 mg | ORAL_TABLET | Freq: Every day | ORAL | 0 refills | Status: DC | PRN

## 2018-11-16 MED ORDER — ATORVASTATIN CALCIUM 10 MG PO TABS: 10.0000 mg | ORAL_TABLET | Freq: Every day | ORAL | 0 refills | Status: DC

## 2018-11-16 MED ORDER — LISINOPRIL 5 MG PO TABS: ORAL_TABLET | ORAL | 0 refills | Status: DC

## 2018-11-16 MED ORDER — METFORMIN HCL 500 MG PO TABS: 500.0000 mg | ORAL_TABLET | Freq: Two times a day (BID) | ORAL | 0 refills | Status: DC

## 2018-11-16 NOTE — Patient Instructions (Signed)
I value your feedback and entrusting us with your care. If you get a [facility] patient survey, I would appreciate you taking the time to let us know about your experience today. Thank you! 

## 2018-11-16 NOTE — Progress Notes (Signed)
Chief Complaint  Patient presents with  . Gynecologic Exam    for the last few months has been having a lot of hot flashes and spotting in between periods, has questions of palm itching shes been having     HPI:      [name] is a 43 y.o. G0P0000 who LMP was Patient's last menstrual period was 11/01/2018 (approximate)., presents today for her annual examination. Her menses are regular every 28-30 days, lasting 7-8 days now, mod flow with clots.  Dysmenorrhea mild, occurring first 1-2 days of flow. Takes NSAIDs with relief. Having BTB for 1-6 days, light flow, for several months. Under increased stress recently. She has a hx of PCOS. Also having vasomotor sx.   She is not sex active currently due to husband's health issue-- contraception - none. Conception ok. Pt aware pregnancy and atorvastatin not recommended so needs to f/u before trying to conceive. Last Pap: June 25, 2015  Results were: no abnormalities /neg HPV DNA STD: hx of LGSIL/HPV DNA  Last mammogram: 03/09/18 Results were: normal--routine follow-up in 12 months There is a FH of breast cancer in her mom and mat aunt. There is no FH of ovarian cancer. Pt had neg MyRisk testing 2017. The patient does do self-breast exams. IBIS=24%. She has not had screening breast MRI and declined last yr. She is taking Vit D supp.  Tobacco use: The patient currently smokes 1/2 packs of cigarettes per day for the past many years. Alcohol use: none Drug use: none Exercise: not active  She does get adequate calcium and Vitamin D in her diet.  She has a hx of pre-DM that turned into DM 10/17. She takes metformin BID. She takes ACEI and lipitor for DM as well. No side effects. HgA1C increased 7/20 labs. Will have her cont metformin BID and rechk labs 10/20. Diet/wt loss changes. Pt under increased stress.  Pt taking lexapro now for anxiety, prescribed by PCP. Has occas overwhelming anxiety and has leftover alprazolam from a few yrs  ago that relieves sx. Takes very sparingly. Needs Rx RF. Also taking pepcid for GERD due to stress with sx relief.   Has had itchy palms bilat for a few months. No rash, no hx of eczema.  Recent Results (from the past 2160 hour(s))  Comprehensive metabolic panel     Status: Abnormal   Collection Time: 10/19/18  9:09 AM  Result Value Ref Range   Glucose 128 (H) 65 - 99 mg/dL   BUN 10 6 - 24 mg/dL   Creatinine, Ser 0.74 0.57 - 1.00 mg/dL   GFR calc non Af Amer 98 >59 mL/min/1.73   GFR calc Af Amer 113 >59 mL/min/1.73   BUN/Creatinine Ratio 14 9 - 23   Sodium 138 134 - 144 mmol/L   Potassium 5.2 3.5 - 5.2 mmol/L   Chloride 100 96 - 106 mmol/L   CO2 22 20 - 29 mmol/L   Calcium 9.5 8.7 - 10.2 mg/dL   Total Protein 6.5 6.0 - 8.5 g/dL   Albumin 4.0 3.8 - 4.8 g/dL   Globulin, Total 2.5 1.5 - 4.5 g/dL   Albumin/Globulin Ratio 1.6 1.2 - 2.2   Bilirubin Total <0.2 0.0 - 1.2 mg/dL   Alkaline Phosphatase 92 39 - 117 IU/L   AST 11 0 - 40 IU/L   ALT 17 0 - 32 IU/L  Lipid panel     Status: Abnormal   Collection Time: 10/19/18  9:09 AM  Result  Value Ref Range   Cholesterol, Total 152 100 - 199 mg/dL   Triglycerides 165 (H) 0 - 149 mg/dL   HDL 39 (L) >39 mg/dL   VLDL Cholesterol Cal 33 5 - 40 mg/dL   LDL Calculated 80 0 - 99 mg/dL   Chol/HDL Ratio 3.9 0.0 - 4.4 ratio    Comment:                                   T. Chol/HDL Ratio                                             Men  Women                               1/2 Avg.Risk  3.4    3.3                                   Avg.Risk  5.0    4.4                                2X Avg.Risk  9.6    7.1                                3X Avg.Risk 23.4   11.0   Hemoglobin A1c     Status: Abnormal   Collection Time: 10/19/18  9:09 AM  Result Value Ref Range   Hgb A1c MFr Bld 7.3 (H) 4.8 - 5.6 %    Comment:          Prediabetes: 5.7 - 6.4          Diabetes: >6.4          Glycemic control for adults with diabetes: <7.0    Est. average glucose Bld  gHb Est-mCnc 163 mg/dL  Microalbumin / creatinine urine ratio     Status: None   Collection Time: 10/19/18 10:29 AM  Result Value Ref Range   Creatinine, Urine 58.8 Not Estab. mg/dL   Microalbumin, Urine <3.0 Not Estab. ug/mL   Microalb/Creat Ratio <5 0 - 29 mg/g creat    Comment:                        Normal:                0 -  29                        Moderately increased: 30 - 300                        Severely increased:       >300               **Please note reference interval change**       Past Medical History:  Diagnosis Date  . Anxiety   . BRCA gene mutation negative in female 06/2015  . Cervical high risk HPV (human papillomavirus) test  positive 05/15/09;05/22/10;12/16/10;2014   hpv pos  . Dysmenorrhea   . Family history of breast cancer 06/2015  . History of mammogram 02/19/2015   BIRAD I  . History of Papanicolaou smear of cervix 06/25/2015   RNIL;NEG  . Hyperlipidemia   . Increased risk of breast cancer 08/2015   IBIS=24%  . Insomnia   . Onychomycosis 06/12/2014  . Pap smear abnormality of cervix with ASCUS favoring dysplasia 05/22/10;12/16/10;11/27/11;05/30/12  . Pap smear abnormality of cervix with LGSIL 05/15/2009  . Polycystic ovaries   . Type 2 diabetes mellitus ([place])   . Vitamin D deficiency     Past Surgical History:  Procedure Laterality Date  . CERVICAL BIOPSY  W/ LOOP ELECTRODE EXCISION  06/2012   PATH NEG  . COLPOSCOPY  05/15/09;2/17;12/3; 5/13   NO LESIONS  . INTRAUTERINE DEVICE (IUD) INSERTION  08/2007  . IUD REMOVAL  05/30/2012  . LAPAROSCOPIC CHOLECYSTECTOMY  2004  . SKIN LESION EXCISION  2007   DERMATOFIBROMA    Family History  Problem Relation Age of Onset  . Breast cancer Maternal Aunt 40       no contact  . Breast cancer Mother 49  . Anemia Mother   . High Cholesterol Mother   . Hypertension Father   . Cancer Maternal Uncle        pancreatic  . Diabetes Maternal Grandmother   . Hypertension Paternal Grandmother   . Hearing  loss Paternal Grandmother   . Heart disease Paternal Grandmother   . High Cholesterol Paternal Grandmother   . Cancer Cousin        ovarian- cured, no contact  . Hearing loss Paternal Grandfather   . Early death Paternal Grandfather     Social History   Socioeconomic History  . Marital status: Married    Spouse name: Not on file  . Number of children: 0  . Years of education: 16  . Highest education level: Not on file  Occupational History  . Occupation: business  Social Needs  . Financial resource strain: Not on file  . Food insecurity    Worry: Not on file    Inability: Not on file  . Transportation needs    Medical: Not on file    Non-medical: Not on file  Tobacco Use  . Smoking status: Current Every Day Smoker    Packs/day: 1.00  . Smokeless tobacco: Never Used  Substance and Sexual Activity  . Alcohol use: No  . Drug use: No  . Sexual activity: Yes    Birth control/protection: None  Lifestyle  . Physical activity    Days per week: Not on file    Minutes per session: Not on file  . Stress: Not on file  Relationships  . Social Herbalist on phone: Not on file    Gets together: Not on file    Attends religious service: Not on file    Active member of club or organization: Not on file    Attends meetings of clubs or organizations: Not on file    Relationship status: Not on file  . Intimate partner violence    Fear of current or ex partner: Not on file    Emotionally abused: Not on file    Physically abused: Not on file    Forced sexual activity: Not on file  Other Topics Concern  . Not on file  Social History Narrative   Married.   No children.    Works in [place].  Enjoys watching movies, swimming.     Current Outpatient Medications on File Prior to Visit  Medication Sig Dispense Refill  . escitalopram (LEXAPRO) 20 MG tablet Take 1 tablet (20 mg total) by mouth daily. For anxiety. 90 tablet 2  . famotidine (PEPCID) 20 MG tablet Take 1 tablet  (20 mg total) by mouth 2 (two) times daily. For heartburn. 180 tablet 1  . fluticasone (FLONASE) 50 MCG/ACT nasal spray PLACE 1 SPRAY INTO BOTH NOSTRILS 2 (TWO) TIMES DAILY. (Patient not taking: Reported on 11/16/2018) 16 g 2   No current facility-administered medications on file prior to visit.       ROS:  Review of Systems  Constitutional: Negative for fatigue, fever and unexpected weight change.  Respiratory: Negative for cough, shortness of breath and wheezing.   Cardiovascular: Negative for chest pain, palpitations and leg swelling.  Gastrointestinal: Positive for diarrhea. Negative for blood in stool, constipation, nausea and vomiting.  Endocrine: Positive for heat intolerance. Negative for cold intolerance and polyuria.  Genitourinary: Positive for menstrual problem. Negative for dyspareunia, dysuria, flank pain, frequency, genital sores, hematuria, pelvic pain, urgency, vaginal bleeding, vaginal discharge and vaginal pain.  Musculoskeletal: Negative for back pain, joint swelling and myalgias.  Skin: Negative for rash.  Neurological: Negative for dizziness, syncope, light-headedness, numbness and headaches.  Hematological: Negative for adenopathy.  Psychiatric/Behavioral: Positive for agitation. Negative for confusion, sleep disturbance and suicidal ideas. The patient is not nervous/anxious.      Objective: BP 140/90   Ht 5' 1"  (1.549 m)   Wt 288 lb 9.6 oz (130.9 kg)   LMP 11/01/2018 (Approximate)   BMI 54.53 kg/m    Physical Exam Constitutional:      Appearance: She is well-developed.  Genitourinary:     Vulva, cervix, uterus, right adnexa and left adnexa normal.     No vulval lesion or tenderness noted.     Vaginal bleeding present.     No vaginal discharge, erythema or tenderness.     No cervical polyp.     Uterus is not enlarged or tender.     No right or left adnexal mass present.     Right adnexa not tender.     Left adnexa not tender.  Neck:      Musculoskeletal: Normal range of motion.     Thyroid: No thyromegaly.  Cardiovascular:     Rate and Rhythm: Normal rate and regular rhythm.     Heart sounds: Normal heart sounds. No murmur.  Pulmonary:     Effort: Pulmonary effort is normal.     Breath sounds: Normal breath sounds.  Chest:     Breasts:        Right: No mass, nipple discharge, skin change or tenderness.        Left: No mass, nipple discharge, skin change or tenderness.  Abdominal:     Palpations: Abdomen is soft.     Tenderness: There is no abdominal tenderness. There is no guarding.  Musculoskeletal: Normal range of motion.  Neurological:     General: No focal deficit present.     Mental Status: She is alert and oriented to person, place, and time.     Cranial Nerves: No cranial nerve deficit.  Skin:    General: Skin is warm and dry.  Psychiatric:        Mood and Affect: Mood normal.        Behavior: Behavior normal.        Thought Content: Thought content normal.  Judgment: Judgment normal.  Vitals signs reviewed.     Assessment/Plan: Encounter for annual routine gynecological examination -   Cervical cancer screening - Plan: Cytology - PAP, CANCELED: Cytology - PAP,   Screening for HPV (human papillomavirus) - Plan: Cytology - PAP,  History of abnormal cervical Pap smear - Plan: Cytology - PAP,   Screening for breast cancer - Plan: MM 3D SCREEN BREAST BILATERAL, Pt to sched mammo through work.  Increased risk of breast cancer - Plan: Suggested monthly SBE, yearly CBE and mammo. Offered scr breast MRI, pt to call to sched spring 2021 if desires. Cont Vit D supp.  PCOS (polycystic ovarian syndrome) - Plan: Monthly menses, but having DUB. Check GYN u/s and labs. Will call with results.   DUB (dysfunctional uterine bleeding) - Plan: Prolactin, TSH, US PELVIS TRANSVAGINAL NON-OB (TV ONLY),   Thyroid disorder screening - Plan: TSH,   Type 2 diabetes mellitus without complication, without long-term  current use of insulin (HCC) - Plan: atorvastatin (LIPITOR) 10 MG tablet, metFORMIN (GLUCOPHAGE) 500 MG tablet, lisinopril (PRINIVIL) 5 MG tablet, Comprehensive metabolic panel, Lipid panel, Hemoglobin A1c,  Increased HgA1C 7/20 labs. Cont metformin 500 mg BID, rechk labs 10/20. Diet/wt loss changes. Rx RF till labs due.  Hyperlipidemia associated with type 2 diabetes mellitus ([place]) - Plan: atorvastatin (LIPITOR) 10 MG tablet, Lipid panel,  Rx RF till 10/20 labs.  GAD (generalized anxiety disorder) - Plan: ALPRAZolam (XANAX) 0.5 MG tablet, Rx RF xanax to take sparingly.   Meds ordered this encounter  Medications  . atorvastatin (LIPITOR) 10 MG tablet    Sig: Take 1 tablet (10 mg total) by mouth daily.    Dispense:  90 tablet    Refill:  0    Order Specific Question:   Supervising Provider    Answer:   Gae Dry [id number]  . metFORMIN (GLUCOPHAGE) 500 MG tablet    Sig: Take 1 tablet (500 mg total) by mouth 2 (two) times daily with a meal.    Dispense:  180 tablet    Refill:  0    Order Specific Question:   Supervising Provider    Answer:   Gae Dry [id number]  . ALPRAZolam (XANAX) 0.5 MG tablet    Sig: Take 1 tablet (0.5 mg total) by mouth daily as needed for anxiety. Use sparingly.    Dispense:  30 tablet    Refill:  0    Order Specific Question:   Supervising Provider    Answer:   Gae Dry [id number]  . lisinopril (PRINIVIL) 5 MG tablet    Sig: Take 1 tablet daily    Dispense:  90 tablet    Refill:  0    Order Specific Question:   Supervising Provider    Answer:   Gae Dry [[id number]]          GYN counsel breast self exam, mammography screening, adequate intake of calcium and vitamin D, diet and exercise     F/U  Return in about 1 day (around 11/17/2018) for GYN u/s for DUB--[facility] to call pt.  Preslea Rhodus B. Keah Lamba, PA-C 11/16/2018 2:44 PM

## 2018-11-17 LAB — PROLACTIN: Prolactin: 15.4 ng/mL (ref 4.8–23.3)

## 2018-11-17 LAB — TSH: TSH: 3.36 u[IU]/mL (ref 0.450–4.500)

## 2018-11-18 ENCOUNTER — Other Ambulatory Visit: Payer: Self-pay

## 2018-11-18 ENCOUNTER — Ambulatory Visit (INDEPENDENT_AMBULATORY_CARE_PROVIDER_SITE_OTHER): Payer: BC Managed Care – PPO

## 2018-11-18 DIAGNOSIS — N938 Other specified abnormal uterine and vaginal bleeding: Secondary | ICD-10-CM | POA: Diagnosis not present

## 2018-11-18 NOTE — Progress Notes (Signed)
Pt aware.

## 2018-11-18 NOTE — Progress Notes (Signed)
Pls let pt know labs normal. Will f/u with u/s results. Thx.

## 2018-11-21 LAB — CYTOLOGY - PAP: HPV: NOT DETECTED

## 2018-11-22 ENCOUNTER — Encounter: Payer: Self-pay | Admitting: Obstetrics and Gynecology

## 2018-11-22 ENCOUNTER — Telehealth: Payer: Self-pay | Admitting: Obstetrics and Gynecology

## 2018-11-22 NOTE — Telephone Encounter (Addendum)
Pt aware of GYN u/s results and AGUS pap. Needs colpo/EMB with MD. Hx of PCOS and AUB. Also with LGSIL in distant past. Pt to sched.   ULTRASOUND REPORT  Location: [facility]  Date of Service: 11/18/2018     Indications:Abnormal Uterine Bleeding Findings:  The uterus is anteverted and measures 9.1 x 4.2 x 4.5 cm. Echo texture is heterogenous with questionable evidence of a 7 mm intramural fibroid at the fundus.  There is a 4.8 mm simple cyst in the posterior myometrium.   There is a simple nabothian cyst in the cervix measuring 2.0 x 1.4 x 2.3 cm  The Endometrium measures 7.2 mm. The endometrium is heterogeneous. The patient is currently having vagina bleeding.   Right Ovary measures 3.3 x 1.7 x 2.0 cm. It is normal in appearance. Left Ovary measures 3.3 x 1.7 x 2.2 cm. It is normal in appearance. There is a dominant follicle in the left ovary measuring 15 x 11 x 13 mm.  Survey of the adnexa demonstrates no adnexal masses. There is no free fluid in the cul de sac.  Impression: 1. Heterogeneous endometrium.  2. 2.3 cm simple nabothian cyst in the cervix.  3. There is a questionable 7 mm intramural fibroid in the uterine fundus. 4. Normal ovaries.   Recommendations: 1.Clinical correlation with the patient's History and Physical Exam.  Gweneth Dimitri, RT   Review of ULTRASOUND.    I have personally reviewed images and report of recent ultrasound done at [facility].    Plan of management to be discussed with pa Jenaro Souder  Barnett Applebaum, MD, [facility], [facility] 11/21/2018  11:28 AM

## 2018-11-24 ENCOUNTER — Encounter: Payer: Self-pay | Admitting: Obstetrics and Gynecology

## 2018-11-30 ENCOUNTER — Encounter: Payer: Self-pay | Admitting: Obstetrics and Gynecology

## 2018-12-02 ENCOUNTER — Other Ambulatory Visit (HOSPITAL_COMMUNITY)
Admission: RE | Admit: 2018-12-02 | Discharge: 2018-12-02 | Disposition: A | Discharge status: Home / Self Care | Payer: BC Managed Care – PPO | Source: Ambulatory Visit | Attending: Obstetrics & Gynecology | Admitting: Obstetrics & Gynecology

## 2018-12-02 ENCOUNTER — Other Ambulatory Visit: Payer: Self-pay

## 2018-12-02 ENCOUNTER — Encounter: Payer: Self-pay | Admitting: Obstetrics & Gynecology

## 2018-12-02 ENCOUNTER — Ambulatory Visit (INDEPENDENT_AMBULATORY_CARE_PROVIDER_SITE_OTHER): Payer: BC Managed Care – PPO | Admitting: Obstetrics & Gynecology

## 2018-12-02 VITALS — BP 138/80 | Ht 61.0 in | Wt 289.0 lb

## 2018-12-02 DIAGNOSIS — N840 Polyp of corpus uteri: Secondary | ICD-10-CM | POA: Diagnosis not present

## 2018-12-02 DIAGNOSIS — R87619 Unspecified abnormal cytological findings in specimens from cervix uteri: Secondary | ICD-10-CM

## 2018-12-02 DIAGNOSIS — E282 Polycystic ovarian syndrome: Secondary | ICD-10-CM

## 2018-12-02 DIAGNOSIS — N938 Other specified abnormal uterine and vaginal bleeding: Secondary | ICD-10-CM

## 2018-12-02 NOTE — Patient Instructions (Signed)

## 2018-12-02 NOTE — Progress Notes (Signed)
HPI:  Jaime Gilmore is a 43 y.o.  G0P0000  who presents today for evaluation and management of abnormal cervical cytology.    Dysplasia History:  AGUS Has prior h/o LGSIL and cervical dysplasia S/p LEEP years ago Has been having irreg bleeding of recent note  ROS:  Pertinent items are noted in HPI.  OB History  Gravida Para Term Preterm AB Living  0 0 0 0 0 0  SAB TAB Ectopic Multiple Live Births  0 0 0 0 0    Past Medical History:  Diagnosis Date  . Anxiety   . BRCA gene mutation negative in female 06/2015  . Cervical high risk HPV (human papillomavirus) test positive 05/15/09;05/22/10;12/16/10;2014   hpv pos  . Dysmenorrhea   . Family history of breast cancer 06/2015  . History of mammogram 02/19/2015   BIRAD I  . History of Papanicolaou smear of cervix 06/25/2015   RNIL;NEG  . Hyperlipidemia   . Increased risk of breast cancer 08/2015   IBIS=24%  . Insomnia   . Onychomycosis 06/12/2014  . Pap smear abnormality of cervix with ASCUS favoring dysplasia 05/22/10;12/16/10;11/27/11;05/30/12  . Pap smear abnormality of cervix with LGSIL 05/15/2009  . Polycystic ovaries   . Type 2 diabetes mellitus ([place])   . Vitamin D deficiency     Past Surgical History:  Procedure Laterality Date  . CERVICAL BIOPSY  W/ LOOP ELECTRODE EXCISION  06/2012   PATH NEG  . COLPOSCOPY  05/15/09;2/17;12/3; 5/13   NO LESIONS  . INTRAUTERINE DEVICE (IUD) INSERTION  08/2007  . IUD REMOVAL  05/30/2012  . LAPAROSCOPIC CHOLECYSTECTOMY  2004  . SKIN LESION EXCISION  2007   DERMATOFIBROMA    SOCIAL HISTORY: Social History   Substance and Sexual Activity  Alcohol Use No   Social History   Substance and Sexual Activity  Drug Use No     Family History  Problem Relation Age of Onset  . Breast cancer Maternal Aunt 40       no contact  . Breast cancer Mother 35  . Anemia Mother   . High Cholesterol Mother   . Hypertension Father   . Cancer Maternal Uncle        pancreatic  . Diabetes  Maternal Grandmother   . Hypertension Paternal Grandmother   . Hearing loss Paternal Grandmother   . Heart disease Paternal Grandmother   . High Cholesterol Paternal Grandmother   . Cancer Cousin        ovarian- cured, no contact  . Hearing loss Paternal Grandfather   . Early death Paternal Grandfather     ALLERGIES:  Patient has no known allergies.  Current Outpatient Medications on File Prior to Visit  Medication Sig Dispense Refill  . ALPRAZolam (XANAX) 0.5 MG tablet Take 1 tablet (0.5 mg total) by mouth daily as needed for anxiety. Use sparingly. 30 tablet 0  . atorvastatin (LIPITOR) 10 MG tablet Take 1 tablet (10 mg total) by mouth daily. 90 tablet 0  . escitalopram (LEXAPRO) 20 MG tablet Take 1 tablet (20 mg total) by mouth daily. For anxiety. 90 tablet 2  . famotidine (PEPCID) 20 MG tablet Take 1 tablet (20 mg total) by mouth 2 (two) times daily. For heartburn. 180 tablet 1  . fluticasone (FLONASE) 50 MCG/ACT nasal spray PLACE 1 SPRAY INTO BOTH NOSTRILS 2 (TWO) TIMES DAILY. 16 g 2  . lisinopril (PRINIVIL) 5 MG tablet Take 1 tablet daily 90 tablet 0  . metFORMIN (GLUCOPHAGE) 500 MG tablet Take  1 tablet (500 mg total) by mouth 2 (two) times daily with a meal. 180 tablet 0   No current facility-administered medications on file prior to visit.     Physical Exam: -Vitals:  BP 138/80   Ht 5' 1"  (1.549 m)   Wt 289 lb (131.1 kg)   LMP 11/01/2018   BMI 54.61 kg/m  GEN: WD, WN, NAD.  A+ O x 3, good mood and affect. ABD:  NT, ND.  Soft, no masses.  No hernias noted.   Pelvic:   Vulva: Normal appearance.  No lesions.  Vagina: No lesions or abnormalities noted.  Support: Normal pelvic support.  Urethra No masses tenderness or scarring.  Meatus Normal size without lesions or prolapse.  Cervix: See below.  Anus: Normal exam.  No lesions.  Perineum: Normal exam.  No lesions.        Bimanual   Uterus: Normal size.  Non-tender.  Mobile.  AV.  Adnexae: No masses.  Non-tender to  palpation.  Cul-de-sac: Negative for abnormality.   PROCEDURE: 1.  Urine Pregnancy Test:  negative 2.  Colposcopy performed with 4% acetic acid after verbal consent obtained                                         -Aceto-white Lesions Location(s): none. However, scar effect from prior procedures noted on external cervix              -Biopsy performed at 6, 12 o'clock               -ECC indicated and performed: No.     -Biopsy sites made hemostatic with pressure, AgNO3, and/or Monsel's solution   -Satisfactory colposcopy: Yes.      -Evidence of Invasive cervical CA :  NO  ASSESSMENT:  Jaime Gilmore is a 43 y.o. G0P0000 here for  1. Atypical glandular cells of undetermined significance (AGUS) on cervical Pap smear   2. PCOS (polycystic ovarian syndrome)   3. DUB (dysfunctional uterine bleeding)   .  PLAN: 1.  I discussed the grading system of pap smears and HPV high risk viral types.  We will discuss and base management after colpo results return. 2. Follow up PAP 6 months, vs intervention if high grade dysplasia identified 3. Treatment of persistantly abnormal PAP smears and cervical dysplasia, even mild, is discussed w pt today in detail, as well as the pros and cons of Cryo and LEEP procedures. Will consider and discuss after results.   Endometrial Biopsy After discussion with the patient regarding her abnormal uterine bleeding I recommended that she proceed with an endometrial biopsy for further diagnosis. The risks, benefits, alternatives, and indications for an endometrial biopsy were discussed with the patient in detail. She understood the risks including infection, bleeding, cervical laceration and uterine perforation.  Verbal consent was obtained.   PROCEDURE NOTE:  [place] endometrial biopsy was performed using aseptic technique with iodine preparation.  Dilator used and difficult exam due to scar tissue and slight stenosis.   The uterus was sounded to a length of 7 cm.   Adequate sampling was obtained with minimal blood loss.  The patient tolerated the procedure well.  Disposition will be pending pathology.  Barnett Applebaum, MD, [facility], [facility] 12/02/2018  9:30 AM

## 2018-12-06 NOTE — Progress Notes (Signed)
PAP AGUS COLPO/ BX normal Diagnosis 1. Endometrium, biopsy - ENDOMETRIAL POLYP WITH NON-ATYPICAL HYPERPLASIA. 2. Cervix, biopsy, 12 o'clock - BENIGN SQUAMOUS MUCOSA. 3. Cervix, biopsy, 6 o'clock - BENIGN SQUAMOUS MUCOSA. D/w pt.  PAP 6 mos.  Barnett Applebaum, MD, [facility], [facility] 12/06/2018  9:01 AM

## 2018-12-12 DIAGNOSIS — K219 Gastro-esophageal reflux disease without esophagitis: Secondary | ICD-10-CM

## 2018-12-12 MED ORDER — FAMOTIDINE 20 MG PO TABS: 20.0000 mg | ORAL_TABLET | Freq: Two times a day (BID) | ORAL | 1 refills | Status: DC

## 2019-01-06 ENCOUNTER — Other Ambulatory Visit: Payer: Self-pay | Admitting: *Deleted

## 2019-01-06 DIAGNOSIS — Z20828 Contact with and (suspected) exposure to other viral communicable diseases: Secondary | ICD-10-CM

## 2019-01-06 DIAGNOSIS — Z20822 Contact with and (suspected) exposure to covid-19: Secondary | ICD-10-CM

## 2019-01-06 DIAGNOSIS — R6889 Other general symptoms and signs: Secondary | ICD-10-CM | POA: Diagnosis not present

## 2019-01-07 LAB — NOVEL CORONAVIRUS, NAA: SARS-CoV-2, NAA: NOT DETECTED

## 2019-01-25 ENCOUNTER — Other Ambulatory Visit: Payer: Self-pay | Admitting: Obstetrics and Gynecology

## 2019-01-25 DIAGNOSIS — E119 Type 2 diabetes mellitus without complications: Secondary | ICD-10-CM

## 2019-01-25 MED ORDER — METFORMIN HCL 500 MG PO TABS: 500.0000 mg | ORAL_TABLET | Freq: Two times a day (BID) | ORAL | 0 refills | Status: DC

## 2019-01-25 NOTE — Telephone Encounter (Signed)
advise

## 2019-01-25 NOTE — Progress Notes (Signed)
Rx RF metformin till labs can get done.

## 2019-01-26 ENCOUNTER — Other Ambulatory Visit: Payer: BC Managed Care – PPO

## 2019-01-26 ENCOUNTER — Other Ambulatory Visit: Payer: Self-pay

## 2019-01-26 DIAGNOSIS — E785 Hyperlipidemia, unspecified: Secondary | ICD-10-CM | POA: Diagnosis not present

## 2019-01-26 DIAGNOSIS — E1169 Type 2 diabetes mellitus with other specified complication: Secondary | ICD-10-CM | POA: Diagnosis not present

## 2019-01-26 DIAGNOSIS — E119 Type 2 diabetes mellitus without complications: Secondary | ICD-10-CM

## 2019-01-27 ENCOUNTER — Other Ambulatory Visit: Payer: Self-pay | Admitting: Obstetrics and Gynecology

## 2019-01-27 DIAGNOSIS — E119 Type 2 diabetes mellitus without complications: Secondary | ICD-10-CM

## 2019-01-27 LAB — LIPID PANEL
Chol/HDL Ratio: 3.6 ratio (ref 0.0–4.4)
Cholesterol, Total: 137 mg/dL (ref 100–199)
HDL: 38 mg/dL — ABNORMAL LOW (ref >39)
LDL Chol Calc (NIH): 71 mg/dL (ref 0–99)
Triglycerides: 163 mg/dL — ABNORMAL HIGH (ref 0–149)
VLDL Cholesterol Cal: 28 mg/dL (ref 5–40)

## 2019-01-27 LAB — COMPREHENSIVE METABOLIC PANEL
ALT: 18 IU/L (ref 0–32)
AST: 13 IU/L (ref 0–40)
Albumin/Globulin Ratio: 1.8 (ref 1.2–2.2)
Albumin: 4.1 g/dL (ref 3.8–4.8)
Alkaline Phosphatase: 111 IU/L (ref 39–117)
BUN/Creatinine Ratio: 15 (ref 9–23)
BUN: 10 mg/dL (ref 6–24)
Bilirubin Total: <0.2 mg/dL (ref 0.0–1.2)
CO2: 23 mmol/L (ref 20–29)
Calcium: 9.6 mg/dL (ref 8.7–10.2)
Chloride: 101 mmol/L (ref 96–106)
Creatinine, Ser: 0.68 mg/dL (ref 0.57–1.00)
GFR calc Af Amer: 122 mL/min/{1.73_m2} (ref >59)
GFR calc non Af Amer: 106 mL/min/{1.73_m2} (ref >59)
Globulin, Total: 2.3 g/dL (ref 1.5–4.5)
Glucose: 148 mg/dL — ABNORMAL HIGH (ref 65–99)
Potassium: 5.4 mmol/L — ABNORMAL HIGH (ref 3.5–5.2)
Sodium: 138 mmol/L (ref 134–144)
Total Protein: 6.4 g/dL (ref 6.0–8.5)

## 2019-01-27 LAB — HEMOGLOBIN A1C
Est. average glucose Bld gHb Est-mCnc: 174 mg/dL
Hgb A1c MFr Bld: 7.7 % — ABNORMAL HIGH (ref 4.8–5.6)

## 2019-01-30 ENCOUNTER — Other Ambulatory Visit: Payer: Self-pay | Admitting: Obstetrics and Gynecology

## 2019-01-30 DIAGNOSIS — E119 Type 2 diabetes mellitus without complications: Secondary | ICD-10-CM

## 2019-01-30 DIAGNOSIS — E1169 Type 2 diabetes mellitus with other specified complication: Secondary | ICD-10-CM

## 2019-01-30 DIAGNOSIS — E785 Hyperlipidemia, unspecified: Secondary | ICD-10-CM

## 2019-01-30 MED ORDER — ATORVASTATIN CALCIUM 10 MG PO TABS: 10.0000 mg | ORAL_TABLET | Freq: Every day | ORAL | 0 refills | Status: DC

## 2019-01-30 MED ORDER — METFORMIN HCL 500 MG PO TABS: 500.0000 mg | ORAL_TABLET | Freq: Two times a day (BID) | ORAL | 0 refills | Status: DC

## 2019-01-30 MED ORDER — LISINOPRIL 5 MG PO TABS: ORAL_TABLET | ORAL | 0 refills | Status: DC

## 2019-01-30 MED ORDER — METFORMIN HCL 500 MG PO TABS: 500.0000 mg | ORAL_TABLET | Freq: Two times a day (BID) | ORAL | 1 refills | Status: DC

## 2019-01-30 NOTE — Progress Notes (Signed)
Rx RF lipitor, lisinopril, metformin. Pt to f/u with PCP for DM mgmt since HgA1C levels keep increasing.

## 2019-02-19 ENCOUNTER — Other Ambulatory Visit: Payer: Self-pay | Admitting: Obstetrics and Gynecology

## 2019-02-19 DIAGNOSIS — E119 Type 2 diabetes mellitus without complications: Secondary | ICD-10-CM

## 2019-03-06 DIAGNOSIS — E119 Type 2 diabetes mellitus without complications: Secondary | ICD-10-CM

## 2019-03-07 IMAGING — MG DIGITAL SCREENING BILATERAL MAMMOGRAM WITH TOMO AND CAD
8 series · 8 of 24 positions shown · non-contrast
Comparison: Previous exam(s).

CLINICAL DATA: Screening.

EXAM:
DIGITAL SCREENING BILATERAL MAMMOGRAM WITH TOMO AND CAD

[R CC synth-2D]
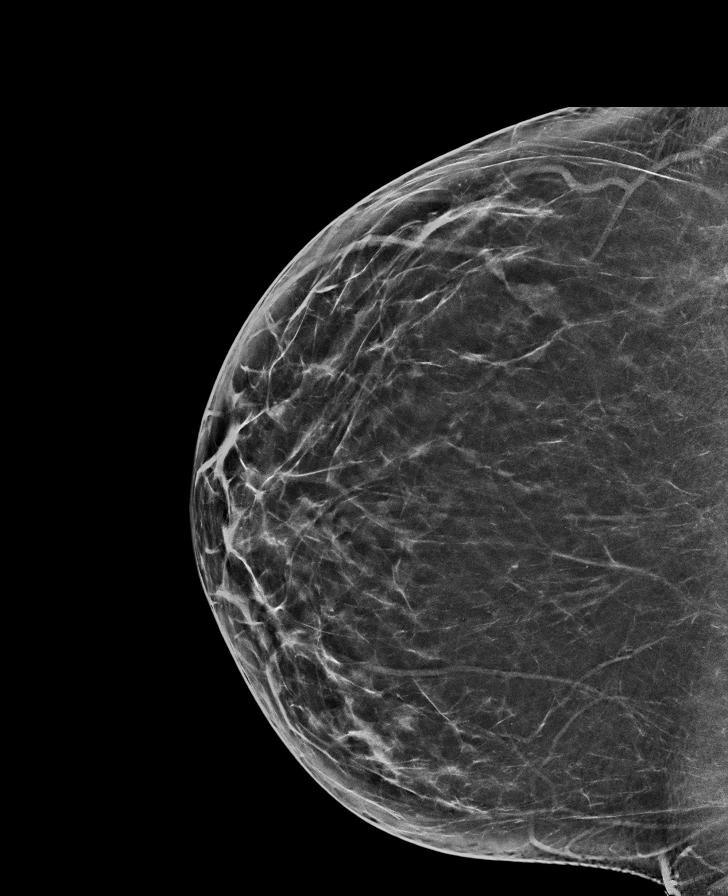

[L MLO synth-2D]
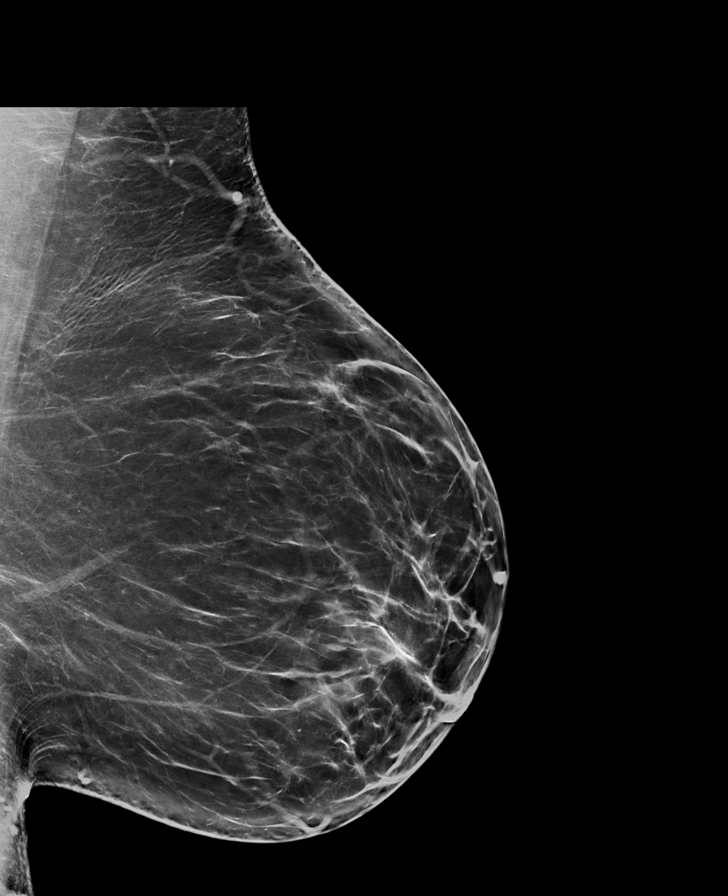

[R MLO synth-2D]
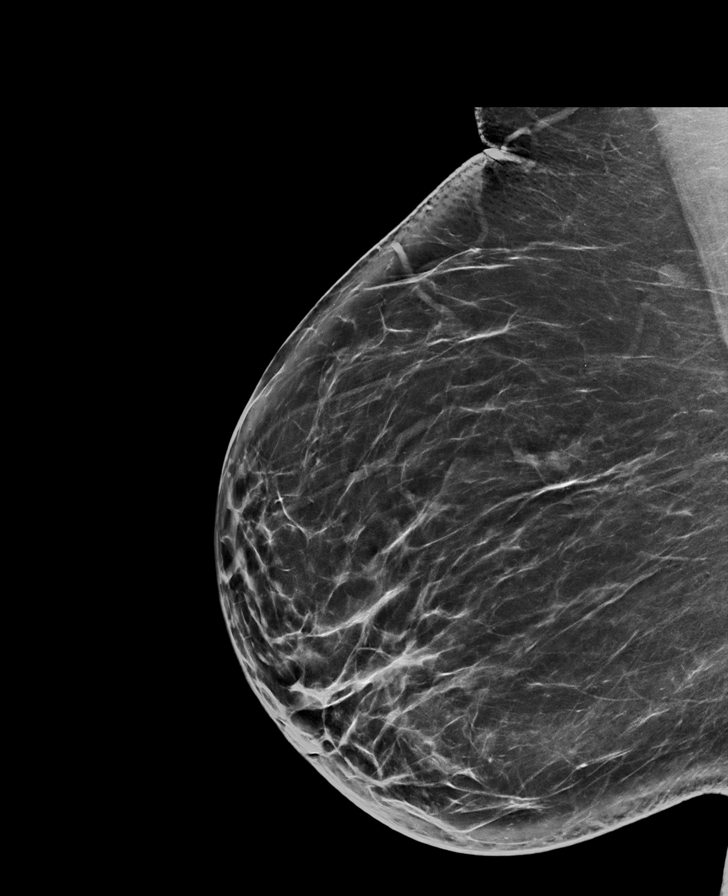

[L CC synth-2D]
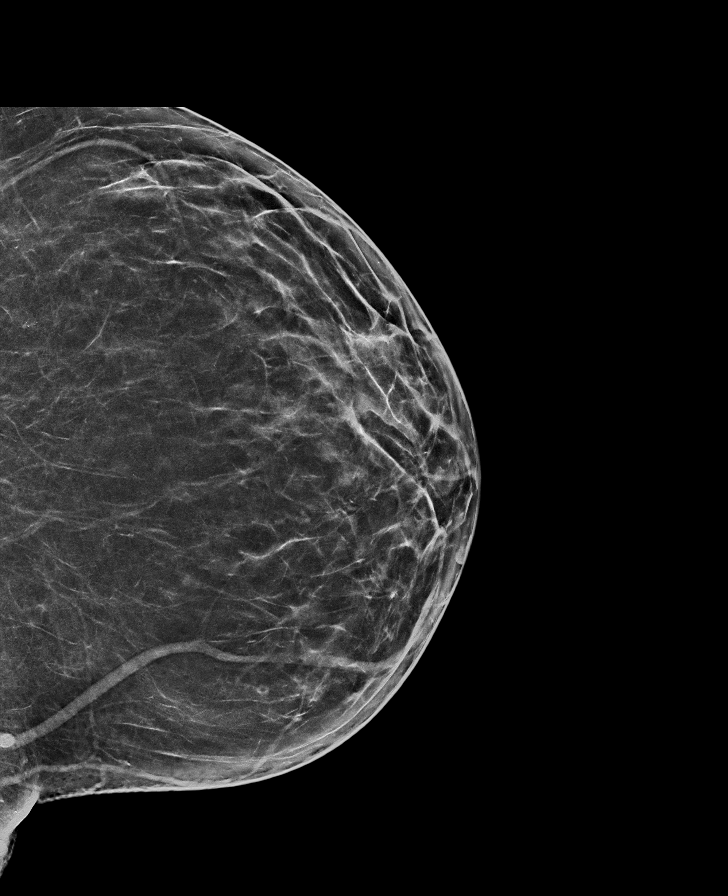

[R CC tomo · tomo slice 39/76.0]
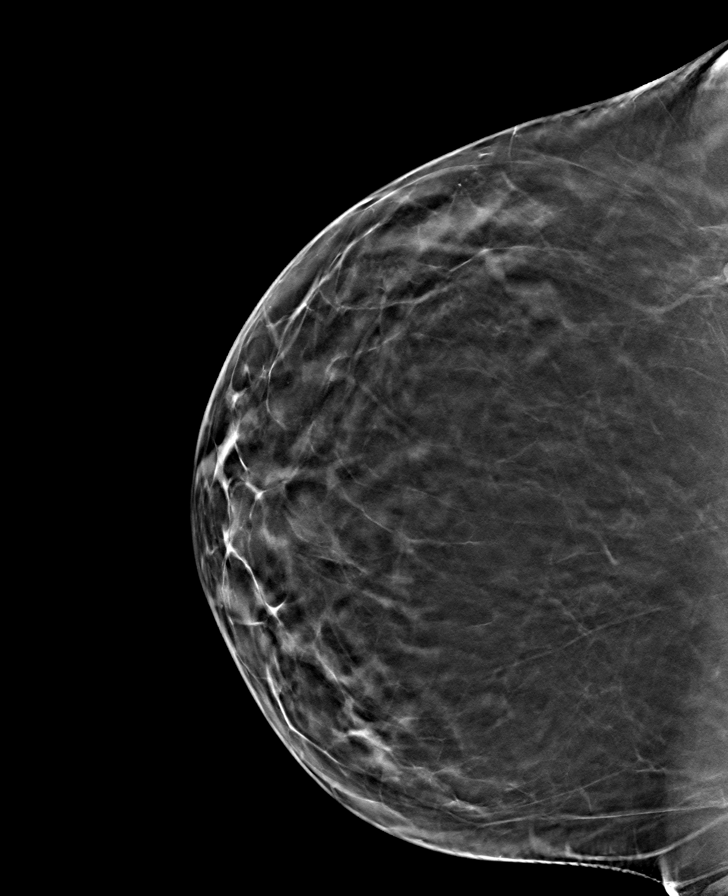

[L MLO tomo · tomo slice 46/91.0]
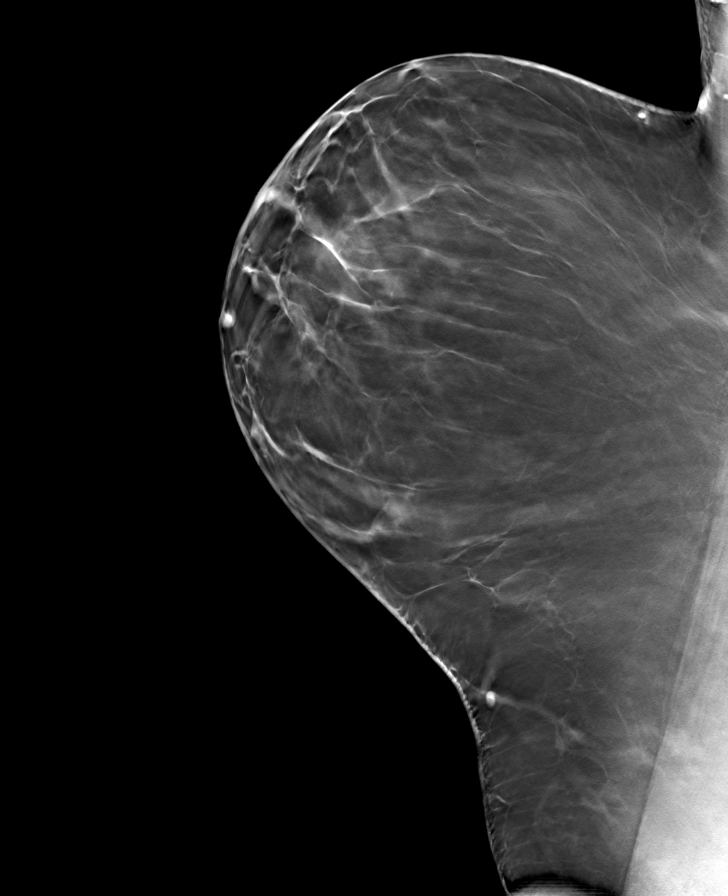

[R MLO tomo · tomo slice 44/87.0]
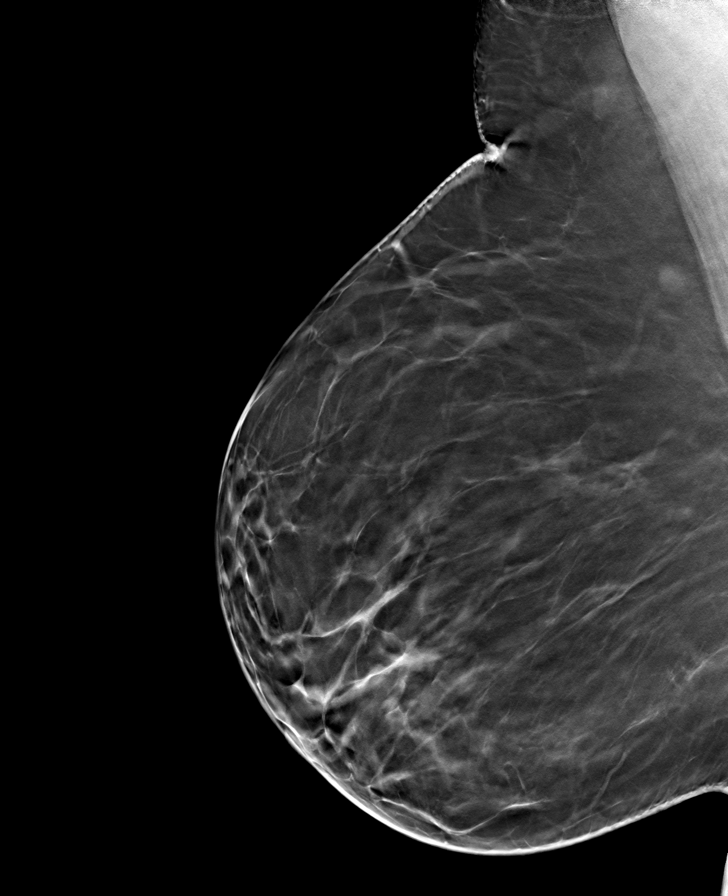

[L CC tomo · tomo slice 35/70.0]
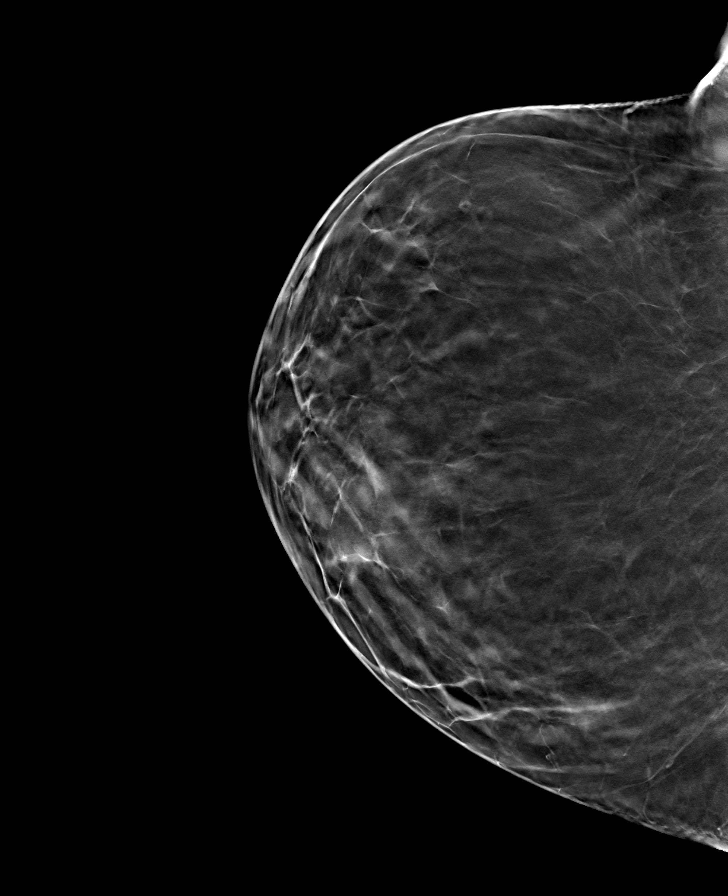

[8 of 24 positions shown; findings below may reference images not displayed]

ACR Breast Density Category b: There are scattered areas of
fibroglandular density.
FINDINGS: There are no findings suspicious for malignancy. Images were
processed with CAD.
IMPRESSION: No mammographic evidence of malignancy. A result letter of this
screening mammogram will be mailed directly to the patient.

RECOMMENDATION:
Screening mammogram in one year. (Code:[id number])

BI-RADS CATEGORY  1: Negative.

## 2019-03-07 MED ORDER — METFORMIN HCL 500 MG PO TABS: 500.0000 mg | ORAL_TABLET | Freq: Two times a day (BID) | ORAL | 1 refills | Status: DC

## 2019-04-07 ENCOUNTER — Other Ambulatory Visit: Payer: Self-pay | Admitting: Obstetrics and Gynecology

## 2019-04-07 DIAGNOSIS — E785 Hyperlipidemia, unspecified: Secondary | ICD-10-CM

## 2019-04-07 DIAGNOSIS — E119 Type 2 diabetes mellitus without complications: Secondary | ICD-10-CM

## 2019-04-07 DIAGNOSIS — E1169 Type 2 diabetes mellitus with other specified complication: Secondary | ICD-10-CM

## 2019-04-08 ENCOUNTER — Other Ambulatory Visit: Payer: Self-pay | Admitting: Primary Care

## 2019-04-08 DIAGNOSIS — F411 Generalized anxiety disorder: Secondary | ICD-10-CM

## 2019-05-04 ENCOUNTER — Other Ambulatory Visit: Payer: Self-pay | Admitting: Obstetrics and Gynecology

## 2019-05-04 DIAGNOSIS — E119 Type 2 diabetes mellitus without complications: Secondary | ICD-10-CM

## 2019-06-05 ENCOUNTER — Ambulatory Visit: Payer: BC Managed Care – PPO | Admitting: Obstetrics & Gynecology

## 2019-06-16 ENCOUNTER — Other Ambulatory Visit (HOSPITAL_COMMUNITY)
Admission: RE | Admit: 2019-06-16 | Discharge: 2019-06-16 | Disposition: A | Discharge status: Home / Self Care | Payer: BC Managed Care – PPO | Source: Ambulatory Visit | Attending: Obstetrics & Gynecology | Admitting: Obstetrics & Gynecology

## 2019-06-16 ENCOUNTER — Encounter: Payer: Self-pay | Admitting: Obstetrics & Gynecology

## 2019-06-16 ENCOUNTER — Ambulatory Visit (INDEPENDENT_AMBULATORY_CARE_PROVIDER_SITE_OTHER): Payer: BC Managed Care – PPO | Admitting: Obstetrics & Gynecology

## 2019-06-16 ENCOUNTER — Other Ambulatory Visit: Payer: Self-pay

## 2019-06-16 VITALS — BP 140/80 | Ht 61.0 in | Wt 288.0 lb

## 2019-06-16 DIAGNOSIS — R87619 Unspecified abnormal cytological findings in specimens from cervix uteri: Secondary | ICD-10-CM | POA: Diagnosis not present

## 2019-06-16 NOTE — Progress Notes (Signed)
HPI:  Patient is a 44 y.o. G0P0000 presenting for follow up evaluation of abnormal PAP smear in the past.  Her last PAP was 6 months ago and was abnormal: AGUS. She has had a prior colposcopy. Prior biopsies (if done) were Normal. PAP AGUS COLPO/ BX normal Diagnosis 1. Endometrium, biopsy - ENDOMETRIAL POLYP WITH NON-ATYPICAL HYPERPLASIA. 2. Cervix, biopsy, 12 o'clock - BENIGN SQUAMOUS MUCOSA. 3. Cervix, biopsy, 6 o'clock - BENIGN SQUAMOUS MUCOSA.  Prior LEEP (3 of them) years ago  PMHx: She  has a past medical history of Anxiety, BRCA gene mutation negative in female (06/2015), Cervical high risk HPV (human papillomavirus) test positive (05/15/09;05/22/10;12/16/10;2014), Dysmenorrhea, Family history of breast cancer (06/2015), History of mammogram (02/19/2015), History of Papanicolaou smear of cervix (06/25/2015), Hyperlipidemia, Increased risk of breast cancer (08/2015), Insomnia, Onychomycosis (06/12/2014), Pap smear abnormality of cervix with ASCUS favoring dysplasia (05/22/10;12/16/10;11/27/11;05/30/12), Pap smear abnormality of cervix with LGSIL (05/15/2009), Polycystic ovaries, Type 2 diabetes mellitus ([place]), and Vitamin D deficiency. Also,  has a past surgical history that includes Skin lesion excision (2007); Laparoscopic cholecystectomy (2004); Colposcopy (05/15/09;2/17;12/3; 5/13); Intrauterine device (iud) insertion (08/2007); IUD removal (05/30/2012); and Cervical biopsy w/ loop electrode excision (06/2012)., family history includes Anemia in her mother; Breast cancer (age of onset: 41) in her maternal aunt; Breast cancer (age of onset: 15) in her mother; Cancer in her cousin and maternal uncle; Diabetes in her maternal grandmother; Early death in her paternal grandfather; Hearing loss in her paternal grandfather and paternal grandmother; Heart disease in her paternal grandmother; High Cholesterol in her mother and paternal grandmother; Hypertension in her father and paternal grandmother.,  reports  that she has been smoking. She has been smoking about 1.00 pack per day. She has never used smokeless tobacco. She reports that she does not drink alcohol or use drugs.  She has a current medication list which includes the following prescription(s): alprazolam, atorvastatin, escitalopram, famotidine, fluticasone, lisinopril, and metformin. Also, has No Known Allergies.  Review of Systems  All other systems reviewed and are negative.   Objective: BP 140/80   Ht 5' 1"  (1.549 m)   Wt 288 lb (130.6 kg)   LMP 05/29/2019   BMI 54.42 kg/m  Filed Weights   06/16/19 1051  Weight: 288 lb (130.6 kg)   Body mass index is 54.42 kg/m.  Physical examination Physical Exam Constitutional:      General: She is not in acute distress.    Appearance: She is well-developed.  Genitourinary:     Pelvic exam was performed with patient supine.     Vagina and uterus normal.     No vaginal erythema or bleeding.     No cervical motion tenderness, discharge, polyp or nabothian cyst.     Uterus is mobile.     Uterus is not enlarged.     No uterine mass detected.    Uterus is midaxial.     No right or left adnexal mass present.     Right adnexa not tender.     Left adnexa not tender.  HENT:     Head: Normocephalic and atraumatic.     Nose: Nose normal.  Abdominal:     General: There is no distension.     Palpations: Abdomen is soft.     Tenderness: There is no abdominal tenderness.  Musculoskeletal:        General: Normal range of motion.  Neurological:     Mental Status: She is alert and oriented to person, place, and time.  Cranial Nerves: No cranial nerve deficit.  Skin:    General: Skin is warm and dry.  Psychiatric:        Attention and Perception: Attention normal.        Mood and Affect: Mood and affect normal.        Speech: Speech normal.        Behavior: Behavior normal.        Thought Content: Thought content normal.        Judgment: Judgment normal.     ASSESSMENT:   History of Cervical Dysplasia - PRIOR AGUS PRIOR LEEPs  Plan:  1.  I discussed the grading system of pap smears and HPV high risk viral types.   2. Follow up PAP 6 months, vs intervention if high grade dysplasia identified. 3. Discussed repeat LEEP vs hysterectomy if high grade found, prefers hysterectomy as has shortened cervix from prior multiple LEEPs and longstanding h/o dysplasia and concern for AGUS last PAP with EMB showing non-atypical hyperplasia   A total of 20 minutes were spent face-to-face with the patient as well as preparation, review, communication, and documentation during this encounter.    Barnett Applebaum, MD, [facility], [facility] 06/16/2019  11:13 AM

## 2019-06-18 ENCOUNTER — Other Ambulatory Visit: Payer: Self-pay | Admitting: Obstetrics and Gynecology

## 2019-06-18 DIAGNOSIS — E119 Type 2 diabetes mellitus without complications: Secondary | ICD-10-CM

## 2019-06-18 DIAGNOSIS — E785 Hyperlipidemia, unspecified: Secondary | ICD-10-CM

## 2019-06-18 DIAGNOSIS — E1169 Type 2 diabetes mellitus with other specified complication: Secondary | ICD-10-CM

## 2019-06-19 ENCOUNTER — Other Ambulatory Visit: Payer: Self-pay | Admitting: Obstetrics and Gynecology

## 2019-06-19 DIAGNOSIS — E785 Hyperlipidemia, unspecified: Secondary | ICD-10-CM

## 2019-06-19 DIAGNOSIS — E119 Type 2 diabetes mellitus without complications: Secondary | ICD-10-CM

## 2019-06-19 DIAGNOSIS — E1169 Type 2 diabetes mellitus with other specified complication: Secondary | ICD-10-CM

## 2019-06-20 ENCOUNTER — Encounter: Payer: Self-pay | Admitting: Obstetrics and Gynecology

## 2019-06-20 DIAGNOSIS — E1169 Type 2 diabetes mellitus with other specified complication: Secondary | ICD-10-CM

## 2019-06-20 DIAGNOSIS — E119 Type 2 diabetes mellitus without complications: Secondary | ICD-10-CM

## 2019-06-20 NOTE — Telephone Encounter (Signed)
I don't know why i'm getting all your messages routed to me

## 2019-06-20 NOTE — Telephone Encounter (Signed)
advise

## 2019-06-21 LAB — CYTOLOGY - PAP
Adequacy: ABSENT
Diagnosis: NEGATIVE

## 2019-06-21 NOTE — Telephone Encounter (Signed)
These Rx were last prescribed by Alice Copland at the [facility]. Patient had lab done there too.  Last appointment here on 06/23/2018. No future appointment

## 2019-06-22 NOTE — Telephone Encounter (Signed)
Jaime Gilmore, she has not seen me in one year. I am happy to take over these meds from GYN, especially diabetes, but she needs a follow up office visit with me for repeat A1C. I need to see her at least twice a year for diabetes.   I need to physically see her annually for documentation purposes in order to refill meds, please explain that. Please set up a diabetes follow up for now. I'll send refills once this is scheduled.

## 2019-06-23 MED ORDER — ATORVASTATIN CALCIUM 10 MG PO TABS: 10.0000 mg | ORAL_TABLET | Freq: Every day | ORAL | 0 refills | Status: DC

## 2019-06-23 MED ORDER — LISINOPRIL 5 MG PO TABS: ORAL_TABLET | ORAL | 0 refills | Status: DC

## 2019-06-23 NOTE — Telephone Encounter (Signed)
Message left for patient to return my call.  

## 2019-06-27 ENCOUNTER — Ambulatory Visit (INDEPENDENT_AMBULATORY_CARE_PROVIDER_SITE_OTHER): Payer: BC Managed Care – PPO | Admitting: Primary Care

## 2019-06-27 ENCOUNTER — Encounter: Payer: Self-pay | Admitting: Primary Care

## 2019-06-27 ENCOUNTER — Other Ambulatory Visit: Payer: Self-pay

## 2019-06-27 VITALS — BP 134/80 | HR 107 | Temp 96.9°F | Ht 61.0 in | Wt 296.5 lb

## 2019-06-27 DIAGNOSIS — F411 Generalized anxiety disorder: Secondary | ICD-10-CM | POA: Diagnosis not present

## 2019-06-27 DIAGNOSIS — E785 Hyperlipidemia, unspecified: Secondary | ICD-10-CM | POA: Diagnosis not present

## 2019-06-27 DIAGNOSIS — E119 Type 2 diabetes mellitus without complications: Secondary | ICD-10-CM | POA: Diagnosis not present

## 2019-06-27 LAB — POCT GLYCOSYLATED HEMOGLOBIN (HGB A1C): Hemoglobin A1C: 8.2 % — AB (ref 4.0–5.6)

## 2019-06-27 MED ORDER — BUSPIRONE HCL 5 MG PO TABS: 5.0000 mg | ORAL_TABLET | Freq: Two times a day (BID) | ORAL | 0 refills | Status: DC

## 2019-06-27 MED ORDER — METFORMIN HCL ER 500 MG PO TB24: 1000.0000 mg | ORAL_TABLET | Freq: Every day | ORAL | 3 refills | Status: DC

## 2019-06-27 NOTE — Assessment & Plan Note (Signed)
Compliant to atorvastatin. Lipid panel updated and LDL at goal.

## 2019-06-27 NOTE — Assessment & Plan Note (Signed)
A1C of 8.2 today which is above goal. She is actually taking her regular metformin at night, taking 1000 mg.  Change to Metformin XR and increase to 1000 mg daily. She agrees.  Foot exam today. Pneumonia vaccination UTD. She will schedule eye exam. Managed on statin and ACE.  Repeat A1C in 3 months, Follow up in 6 months.

## 2019-06-27 NOTE — Progress Notes (Signed)
Subjective:    Patient ID: Jaime Gilmore, female    DOB: 10-04-1975, 44 y.o.   MRN: [id number]  HPI  This visit occurred during the SARS-CoV-2 public health emergency.  Safety protocols were in place, including screening questions prior to the visit, additional usage of staff PPE, and extensive cleaning of exam room while observing appropriate contact time as indicated for disinfecting solutions.   Jaime Gilmore is a 44 year old female with a history of type 2 diabetes, PCOS, hyperlipidemia, GAD who presents today for follow up and medication refills.  1) Type 2 Diabetes:  Current medications include: Metformin 500 mg (1000 mg HS).  She does not check her blood sugars.   Last A1C: 7.7 in October 2020 Last Eye Exam: Due. She will schedule.  Last Foot Exam: Due today Pneumonia Vaccination: Completed in 2019 ACE/ARB: Lisinopril  Statin: atorvastatin   BP Readings from Last 3 Encounters:  06/27/19 134/80  06/16/19 140/80  12/02/18 138/80   2) GAD: Currently managed on Lexapro 20 mg. She does not check her BP at home, has felt anxiety has increased in general due to working from home. She is experiencing daily symptoms of worry, feeling nervous, feeling anxious, irritability. This began several months ago. She is compliant to her Lexapro 20 mg.  She would like additional treatment for anxiety.   Review of Systems  Eyes: Negative for visual disturbance.  Respiratory: Negative for shortness of breath.   Cardiovascular: Negative for chest pain.  Neurological: Negative for dizziness and numbness.  Psychiatric/Behavioral: The patient is nervous/anxious.        See HPI       Past Medical History:  Diagnosis Date  . Anxiety   . BRCA gene mutation negative in female 06/2015  . Cervical high risk HPV (human papillomavirus) test positive 05/15/09;05/22/10;12/16/10;2014   hpv pos  . Dysmenorrhea   . Family history of breast cancer 06/2015  . History of mammogram 02/19/2015   BIRAD I    . History of Papanicolaou smear of cervix 06/25/2015   RNIL;NEG  . Hyperlipidemia   . Increased risk of breast cancer 08/2015   IBIS=24%  . Insomnia   . Onychomycosis 06/12/2014  . Pap smear abnormality of cervix with ASCUS favoring dysplasia 05/22/10;12/16/10;11/27/11;05/30/12  . Pap smear abnormality of cervix with LGSIL 05/15/2009  . Polycystic ovaries   . Type 2 diabetes mellitus ([place])   . Vitamin D deficiency      Social History   Socioeconomic History  . Marital status: Married    Spouse name: Not on file  . Number of children: 0  . Years of education: 70  . Highest education level: Not on file  Occupational History  . Occupation: business  Tobacco Use  . Smoking status: Current Every Day Smoker    Packs/day: 1.00  . Smokeless tobacco: Never Used  Substance and Sexual Activity  . Alcohol use: No  . Drug use: No  . Sexual activity: Yes    Birth control/protection: None  Other Topics Concern  . Not on file  Social History Narrative   Married.   No children.    Works in [place].   Enjoys watching movies, swimming.    Social Determinants of Health   Financial Resource Strain:   . Difficulty of Paying Living Expenses: Not on file  Food Insecurity:   . Worried About Charity fundraiser in the Last Year: Not on file  . Ran Out of Food in the Last Year:  Not on file  Transportation Needs:   . Lack of Transportation (Medical): Not on file  . Lack of Transportation (Non-Medical): Not on file  Physical Activity:   . Days of Exercise per Week: Not on file  . Minutes of Exercise per Session: Not on file  Stress:   . Feeling of Stress : Not on file  Social Connections:   . Frequency of Communication with Friends and Family: Not on file  . Frequency of Social Gatherings with Friends and Family: Not on file  . Attends Religious Services: Not on file  . Active Member of Clubs or Organizations: Not on file  . Attends Archivist Meetings: Not on file  . Marital  Status: Not on file  Intimate Partner Violence:   . Fear of Current or Ex-Partner: Not on file  . Emotionally Abused: Not on file  . Physically Abused: Not on file  . Sexually Abused: Not on file    Past Surgical History:  Procedure Laterality Date  . CERVICAL BIOPSY  W/ LOOP ELECTRODE EXCISION  06/2012   PATH NEG  . COLPOSCOPY  05/15/09;2/17;12/3; 5/13   NO LESIONS  . INTRAUTERINE DEVICE (IUD) INSERTION  08/2007  . IUD REMOVAL  05/30/2012  . LAPAROSCOPIC CHOLECYSTECTOMY  2004  . SKIN LESION EXCISION  2007   DERMATOFIBROMA    Family History  Problem Relation Age of Onset  . Breast cancer Maternal Aunt 40       no contact  . Breast cancer Mother 55  . Anemia Mother   . High Cholesterol Mother   . Hypertension Father   . Cancer Maternal Uncle        pancreatic  . Diabetes Maternal Grandmother   . Hypertension Paternal Grandmother   . Hearing loss Paternal Grandmother   . Heart disease Paternal Grandmother   . High Cholesterol Paternal Grandmother   . Cancer Cousin        ovarian- cured, no contact  . Hearing loss Paternal Grandfather   . Early death Paternal Grandfather     No Known Allergies  Current Outpatient Medications on File Prior to Visit  Medication Sig Dispense Refill  . ALPRAZolam (XANAX) 0.5 MG tablet Take 1 tablet (0.5 mg total) by mouth daily as needed for anxiety. Use sparingly. 30 tablet 0  . atorvastatin (LIPITOR) 10 MG tablet Take 1 tablet (10 mg total) by mouth daily. 90 tablet 0  . escitalopram (LEXAPRO) 20 MG tablet TAKE 1 TABLET (20 MG TOTAL) BY MOUTH DAILY. FOR ANXIETY. 90 tablet 0  . famotidine (PEPCID) 20 MG tablet Take 1 tablet (20 mg total) by mouth 2 (two) times daily. For heartburn. 180 tablet 1  . fluticasone (FLONASE) 50 MCG/ACT nasal spray PLACE 1 SPRAY INTO BOTH NOSTRILS 2 (TWO) TIMES DAILY. 16 g 2  . lisinopril (PRINIVIL) 5 MG tablet Take 1 tablet daily 90 tablet 0  . metFORMIN (GLUCOPHAGE) 500 MG tablet Take 1 tablet (500 mg total)  by mouth 2 (two) times daily with a meal. For diabetes. 180 tablet 1   No current facility-administered medications on file prior to visit.    BP 134/80   Pulse (!) 107   Temp (!) 96.9 F (36.1 C) (Temporal)   Ht _0  (1.549 m)   Wt 296 lb 8 oz (134.5 kg)   LMP 05/29/2019   SpO2 97%   BMI 56.02 kg/m    Objective:   Physical Exam  Constitutional: She appears well-nourished.  Cardiovascular: Normal rate and  regular rhythm.  Respiratory: Effort normal and breath sounds normal.  Musculoskeletal:     Cervical back: Neck supple.  Skin: Skin is warm and dry.  Psychiatric: She has a normal mood and affect.           Assessment & Plan:

## 2019-06-27 NOTE — Patient Instructions (Signed)
Start metformin ER 500 mg. Take 2 tablets by mouth once daily.  Start buspirone (Buspar) 5 mg twice daily for anxiety.  Continue Lexapro.  Start exercising. You should be getting 150 minutes of moderate intensity exercise weekly.  It is important that you improve your diet. Please limit carbohydrates in the form of white bread, rice, pasta, sweets, fast food, fried food, sugary drinks, etc. Increase your consumption of fresh fruits and vegetables, whole grains, lean protein.  Ensure you are consuming 64 ounces of water daily.  Schedule an eye exam.  Schedule a lab appointment to repeat your A1C in 3 months.  Please schedule a follow up appointment in 6 months in person.  It was a pleasure to see you today!

## 2019-06-27 NOTE — Assessment & Plan Note (Signed)
Increased over the last 3+ months, largely secondary to her occupation. She is at max dose of Lexapro at 20 mg. Add in Buspar 5 mg BID. She will update.

## 2019-07-07 ENCOUNTER — Other Ambulatory Visit: Payer: Self-pay | Admitting: Primary Care

## 2019-07-07 DIAGNOSIS — F411 Generalized anxiety disorder: Secondary | ICD-10-CM

## 2019-07-09 ENCOUNTER — Other Ambulatory Visit: Payer: Self-pay | Admitting: Primary Care

## 2019-07-09 DIAGNOSIS — K219 Gastro-esophageal reflux disease without esophagitis: Secondary | ICD-10-CM

## 2019-07-20 ENCOUNTER — Other Ambulatory Visit: Payer: Self-pay | Admitting: Primary Care

## 2019-07-20 DIAGNOSIS — F411 Generalized anxiety disorder: Secondary | ICD-10-CM

## 2019-08-28 LAB — HM DIABETES EYE EXAM

## 2019-09-19 ENCOUNTER — Other Ambulatory Visit: Payer: Self-pay | Admitting: Primary Care

## 2019-09-19 DIAGNOSIS — E119 Type 2 diabetes mellitus without complications: Secondary | ICD-10-CM

## 2019-09-19 DIAGNOSIS — E1169 Type 2 diabetes mellitus with other specified complication: Secondary | ICD-10-CM

## 2019-10-04 ENCOUNTER — Encounter: Payer: Self-pay | Admitting: Obstetrics and Gynecology

## 2019-10-04 ENCOUNTER — Other Ambulatory Visit: Payer: Self-pay | Admitting: Obstetrics and Gynecology

## 2019-10-04 DIAGNOSIS — F411 Generalized anxiety disorder: Secondary | ICD-10-CM

## 2019-10-04 MED ORDER — ALPRAZOLAM 0.5 MG PO TABS: 0.5000 mg | ORAL_TABLET | Freq: Every day | ORAL | 0 refills | Status: DC | PRN

## 2019-10-04 NOTE — Progress Notes (Signed)
Rx RF xanax prn. 

## 2019-10-20 ENCOUNTER — Other Ambulatory Visit: Payer: Self-pay | Admitting: Primary Care

## 2019-10-20 DIAGNOSIS — F411 Generalized anxiety disorder: Secondary | ICD-10-CM

## 2019-12-17 NOTE — Progress Notes (Deleted)
No chief complaint on file.    HPI:      [name] is a 44 y.o. G0P0000 who LMP was No LMP recorded., presents today for her annual examination. Her menses are regular every 28-30 days, lasting 7-8 days now, mod flow with clots.  Dysmenorrhea mild, occurring first 1-2 days of flow. Takes NSAIDs with relief. Having BTB for 1-6 days, light flow, for several months. Under increased stress recently. She has a hx of PCOS. Also having vasomotor sx.   She is not sex active currently due to husband's health issue-- contraception - none. Conception ok. Pt aware pregnancy and atorvastatin not recommended so needs to f/u before trying to conceive. Last Pap: 06/16/19  Results were: no abnormalities /neg HPV DNA 2017. S/p AGUS 7/20, had neg bx on colpo 8/20; repeat pap due 6 months later (dont 2/21). Repeat pap due today. STD: hx of LGSIL/HPV DNA; s/p mult LEEPs  Last mammogram: 03/09/18 Results were: normal--routine follow-up in 12 months There is a FH of breast cancer in her mom and mat aunt. There is no FH of ovarian cancer. Pt had neg MyRisk testing 2017. The patient does do self-breast exams. IBIS=24%. She has not had screening breast MRI and declined last yr. She is taking Vit D supp.  Tobacco use: The patient currently smokes 1/2 packs of cigarettes per day for the past many years. Alcohol use: none Drug use: none Exercise: not active  She does get adequate calciu m and Vitamin D in her diet.  She has a hx of pre-DM that turned into DM 10/17. She takes metformin BID. She takes ACEI and lipitor for DM as well. No side effects. HgA1C increased 7/20 labs. Will have her cont metformin BID and rechk labs 10/20. Diet/wt loss changes. Pt under increased stress.  Pt taking lexapro now for anxiety, prescribed by PCP. Has occas overwhelming anxiety and has leftover alprazolam from a few yrs ago that relieves sx. Takes very sparingly. Needs Rx RF. Also taking pepcid for GERD due to stress  with sx relief.   Has had itchy palms bilat for a few months. No rash, no hx of eczema.  No results found for this or any previous visit (from the past 2160 hour(s)).    Past Medical History:  Diagnosis Date  . Anxiety   . BRCA gene mutation negative in female 06/2015  . Cervical high risk HPV (human papillomavirus) test positive 05/15/09;05/22/10;12/16/10;2014   hpv pos  . Dysmenorrhea   . Family history of breast cancer 06/2015  . History of mammogram 02/19/2015   BIRAD I  . History of Papanicolaou smear of cervix 06/25/2015   RNIL;NEG  . Hyperlipidemia   . Increased risk of breast cancer 08/2015   IBIS=24%  . Insomnia   . Onychomycosis 06/12/2014  . Pap smear abnormality of cervix with ASCUS favoring dysplasia 05/22/10;12/16/10;11/27/11;05/30/12  . Pap smear abnormality of cervix with LGSIL 05/15/2009  . Polycystic ovaries   . Type 2 diabetes mellitus ([place])   . Vitamin D deficiency     Past Surgical History:  Procedure Laterality Date  . CERVICAL BIOPSY  W/ LOOP ELECTRODE EXCISION  06/2012   PATH NEG  . COLPOSCOPY  05/15/09;2/17;12/3; 5/13   NO LESIONS  . INTRAUTERINE DEVICE (IUD) INSERTION  08/2007  . IUD REMOVAL  05/30/2012  . LAPAROSCOPIC CHOLECYSTECTOMY  2004  . SKIN LESION EXCISION  2007   DERMATOFIBROMA    Family History  Problem Relation Age of Onset  .  Breast cancer Maternal Aunt 40       no contact  . Breast cancer Mother 42  . Anemia Mother   . High Cholesterol Mother   . Hypertension Father   . Cancer Maternal Uncle        pancreatic  . Diabetes Maternal Grandmother   . Hypertension Paternal Grandmother   . Hearing loss Paternal Grandmother   . Heart disease Paternal Grandmother   . High Cholesterol Paternal Grandmother   . Cancer Cousin        ovarian- cured, no contact  . Hearing loss Paternal Grandfather   . Early death Paternal Grandfather     Social History   Socioeconomic History  . Marital status: Married    Spouse name: Not on file  .  Number of children: 0  . Years of education: 43  . Highest education level: Not on file  Occupational History  . Occupation: business  Tobacco Use  . Smoking status: Current Every Day Smoker    Packs/day: 1.00  . Smokeless tobacco: Never Used  Vaping Use  . Vaping Use: Never used  Substance and Sexual Activity  . Alcohol use: No  . Drug use: No  . Sexual activity: Yes    Birth control/protection: None  Other Topics Concern  . Not on file  Social History Narrative   Married.   No children.    Works in [place].   Enjoys watching movies, swimming.    Social Determinants of Health   Financial Resource Strain:   . Difficulty of Paying Living Expenses: Not on file  Food Insecurity:   . Worried About Charity fundraiser in the Last Year: Not on file  . Ran Out of Food in the Last Year: Not on file  Transportation Needs:   . Lack of Transportation (Medical): Not on file  . Lack of Transportation (Non-Medical): Not on file  Physical Activity:   . Days of Exercise per Week: Not on file  . Minutes of Exercise per Session: Not on file  Stress:   . Feeling of Stress : Not on file  Social Connections:   . Frequency of Communication with Friends and Family: Not on file  . Frequency of Social Gatherings with Friends and Family: Not on file  . Attends Religious Services: Not on file  . Active Member of Clubs or Organizations: Not on file  . Attends Archivist Meetings: Not on file  . Marital Status: Not on file  Intimate Partner Violence:   . Fear of Current or Ex-Partner: Not on file  . Emotionally Abused: Not on file  . Physically Abused: Not on file  . Sexually Abused: Not on file    Current Outpatient Medications on File Prior to Visit  Medication Sig Dispense Refill  . ALPRAZolam (XANAX) 0.5 MG tablet Take 1 tablet (0.5 mg total) by mouth daily as needed for anxiety. Use sparingly. 30 tablet 0  . atorvastatin (LIPITOR) 10 MG tablet TAKE 1 TABLET BY MOUTH EVERY DAY 90  tablet 1  . busPIRone (BUSPAR) 5 MG tablet TAKE 1 TABLET (5 MG TOTAL) BY MOUTH 2 (TWO) TIMES DAILY. FOR ANXIETY. 180 tablet 1  . escitalopram (LEXAPRO) 20 MG tablet TAKE 1 TABLET (20 MG TOTAL) BY MOUTH DAILY. FOR ANXIETY. 90 tablet 1  . famotidine (PEPCID) 20 MG tablet TAKE 1 TABLET (20 MG TOTAL) BY MOUTH 2 (TWO) TIMES DAILY. FOR HEARTBURN. 180 tablet 1  . fluticasone (FLONASE) 50 MCG/ACT nasal spray PLACE  1 SPRAY INTO BOTH NOSTRILS 2 (TWO) TIMES DAILY. 16 g 2  . lisinopril (ZESTRIL) 5 MG tablet TAKE 1 TABLET BY MOUTH EVERY DAY 90 tablet 1  . metFORMIN (GLUCOPHAGE-XR) 500 MG 24 hr tablet Take 2 tablets (1,000 mg total) by mouth daily with breakfast. For diabetes. 180 tablet 3   No current facility-administered medications on file prior to visit.      ROS:  Review of Systems  Constitutional: Negative for fatigue, fever and unexpected weight change.  Respiratory: Negative for cough, shortness of breath and wheezing.   Cardiovascular: Negative for chest pain, palpitations and leg swelling.  Gastrointestinal: Positive for diarrhea. Negative for blood in stool, constipation, nausea and vomiting.  Endocrine: Positive for heat intolerance. Negative for cold intolerance and polyuria.  Genitourinary: Positive for menstrual problem. Negative for dyspareunia, dysuria, flank pain, frequency, genital sores, hematuria, pelvic pain, urgency, vaginal bleeding, vaginal discharge and vaginal pain.  Musculoskeletal: Negative for back pain, joint swelling and myalgias.  Skin: Negative for rash.  Neurological: Negative for dizziness, syncope, light-headedness, numbness and headaches.  Hematological: Negative for adenopathy.  Psychiatric/Behavioral: Positive for agitation. Negative for confusion, sleep disturbance and suicidal ideas. The patient is not nervous/anxious.      Objective: There were no vitals taken for this visit.   Physical Exam Constitutional:      Appearance: She is well-developed.    Genitourinary:     Vulva, cervix, uterus, right adnexa and left adnexa normal.     No vulval lesion or tenderness noted.     Vaginal bleeding present.     No vaginal discharge, erythema or tenderness.     No cervical polyp.     Uterus is not enlarged or tender.     No right or left adnexal mass present.     Right adnexa not tender.     Left adnexa not tender.  Neck:     Thyroid: No thyromegaly.  Cardiovascular:     Rate and Rhythm: Normal rate and regular rhythm.     Heart sounds: Normal heart sounds. No murmur heard.   Pulmonary:     Effort: Pulmonary effort is normal.     Breath sounds: Normal breath sounds.  Chest:     Breasts:        Right: No mass, nipple discharge, skin change or tenderness.        Left: No mass, nipple discharge, skin change or tenderness.  Abdominal:     Palpations: Abdomen is soft.     Tenderness: There is no abdominal tenderness. There is no guarding.  Musculoskeletal:        General: Normal range of motion.     Cervical back: Normal range of motion.  Neurological:     General: No focal deficit present.     Mental Status: She is alert and oriented to person, place, and time.     Cranial Nerves: No cranial nerve deficit.  Skin:    General: Skin is warm and dry.  Psychiatric:        Mood and Affect: Mood normal.        Behavior: Behavior normal.        Thought Content: Thought content normal.        Judgment: Judgment normal.  Vitals reviewed.     Assessment/Plan: Encounter for annual routine gynecological examination -   Cervical cancer screening - Plan: Cytology - PAP, CANCELED: Cytology - PAP,   Screening for HPV (human papillomavirus) - Plan: Cytology - PAP,  History of abnormal cervical Pap smear - Plan: Cytology - PAP,   Screening for breast cancer - Plan: MM 3D SCREEN BREAST BILATERAL, Pt to sched mammo through work.  Increased risk of breast cancer - Plan: Suggested monthly SBE, yearly CBE and mammo. Offered scr breast MRI, pt  to call to sched spring 2021 if desires. Cont Vit D supp.  PCOS (polycystic ovarian syndrome) - Plan: Monthly menses, but having DUB. Check GYN u/s and labs. Will call with results.   DUB (dysfunctional uterine bleeding) - Plan: Prolactin, TSH, US PELVIS TRANSVAGINAL NON-OB (TV ONLY),   Thyroid disorder screening - Plan: TSH,   Type 2 diabetes mellitus without complication, without long-term current use of insulin (HCC) - Plan: atorvastatin (LIPITOR) 10 MG tablet, metFORMIN (GLUCOPHAGE) 500 MG tablet, lisinopril (PRINIVIL) 5 MG tablet, Comprehensive metabolic panel, Lipid panel, Hemoglobin A1c,  Increased HgA1C 7/20 labs. Cont metformin 500 mg BID, rechk labs 10/20. Diet/wt loss changes. Rx RF till labs due.  Hyperlipidemia associated with type 2 diabetes mellitus ([place]) - Plan: atorvastatin (LIPITOR) 10 MG tablet, Lipid panel,  Rx RF till 10/20 labs.  GAD (generalized anxiety disorder) - Plan: ALPRAZolam (XANAX) 0.5 MG tablet, Rx RF xanax to take sparingly.   No orders of the defined types were placed in this encounter.         GYN counsel breast self exam, mammography screening, adequate intake of calcium and vitamin D, diet and exercise     F/U  No follow-ups on file.  Vic Esco B. Waldron Gerry, PA-C 12/17/2019 3:18 PM

## 2019-12-18 ENCOUNTER — Ambulatory Visit: Payer: BC Managed Care – PPO | Admitting: Obstetrics and Gynecology

## 2020-01-07 ENCOUNTER — Other Ambulatory Visit: Payer: Self-pay | Admitting: Primary Care

## 2020-01-07 DIAGNOSIS — F411 Generalized anxiety disorder: Secondary | ICD-10-CM

## 2020-01-09 DIAGNOSIS — Z20822 Contact with and (suspected) exposure to covid-19: Secondary | ICD-10-CM | POA: Diagnosis not present

## 2020-01-10 ENCOUNTER — Other Ambulatory Visit: Payer: Self-pay | Admitting: Primary Care

## 2020-01-10 DIAGNOSIS — K219 Gastro-esophageal reflux disease without esophagitis: Secondary | ICD-10-CM

## 2020-01-17 ENCOUNTER — Ambulatory Visit: Payer: BC Managed Care – PPO | Admitting: Obstetrics and Gynecology

## 2020-01-22 ENCOUNTER — Other Ambulatory Visit: Payer: BC Managed Care – PPO

## 2020-01-22 DIAGNOSIS — Z20822 Contact with and (suspected) exposure to covid-19: Secondary | ICD-10-CM | POA: Diagnosis not present

## 2020-01-23 LAB — SARS-COV-2, NAA 2 DAY TAT

## 2020-01-23 LAB — NOVEL CORONAVIRUS, NAA: SARS-CoV-2, NAA: NOT DETECTED

## 2020-01-25 ENCOUNTER — Encounter: Payer: Self-pay | Admitting: Family Medicine

## 2020-01-25 ENCOUNTER — Other Ambulatory Visit: Payer: BC Managed Care – PPO

## 2020-01-25 ENCOUNTER — Telehealth (INDEPENDENT_AMBULATORY_CARE_PROVIDER_SITE_OTHER): Payer: BC Managed Care – PPO | Admitting: Family Medicine

## 2020-01-25 VITALS — Temp 98.2°F | Ht 61.0 in

## 2020-01-25 DIAGNOSIS — Z20822 Contact with and (suspected) exposure to covid-19: Secondary | ICD-10-CM | POA: Diagnosis not present

## 2020-01-25 DIAGNOSIS — J0100 Acute maxillary sinusitis, unspecified: Secondary | ICD-10-CM | POA: Diagnosis not present

## 2020-01-25 MED ORDER — AMOXICILLIN 500 MG PO CAPS: 1000.0000 mg | ORAL_CAPSULE | Freq: Two times a day (BID) | ORAL | 0 refills | Status: AC

## 2020-01-25 NOTE — Progress Notes (Signed)
Jaime Gilmore T. Jaime Coor, MD Primary Care and Sports Medicine [facility] [address] [place], [address] Phone: [phone]  FAX: [phone]  Jaime Gilmore - 44 y.o. female  MRN [id number]  Date of Birth: 11/25/1975  Visit Date: 01/25/2020  PCP: Doreene Nest, NP  Referred by: Doreene Nest, NP Chief Complaint  Patient presents with  . Sinusitis    Facial pain/ear pain/sinus drainage x 1 week/Husband has Covid-Patient tested negative on Monday   Virtual Visit via Video Note:  I connected with  Jaime Gilmore on 01/25/2020 10:20 AM EDT by a video enabled telemedicine application and verified that I am speaking with the correct person using two identifiers.   Location patient: home computer, tablet, or smartphone Location provider: work or home office Consent: Verbal consent directly obtained from [employer]. Persons participating in the virtual visit: patient, provider  I discussed the limitations of evaluation and management by telemedicine and the availability of in person appointments. The patient expressed understanding and agreed to proceed.  History of Present Illness:  She has been having allergy symptoms now for weeks, and she presented on Monday having quite a bit of sinus pain and maxillary region along with some drainage and also some ear pain to a lesser extent.  She also is coughing some as well but this is much much more minimal compared to her sinus symptoms and pain.  She denies any nausea, vomiting, diarrhea, loss of taste or smell, neurological changes, rashes, or other GI symptoms.  She does have a known positive COVID-19 exposure and her husband.  She did take a COVID-19 test on Monday, and that was negative.  Sinus pain and drainiage and max pain  Cough a lot and sinus   Has been sick since yesterday Face  At least   Immunization History  Administered Date(s) Administered  .  Influenza,inj,Quad PF,6+ Mos 01/24/2019  . Influenza-Unspecified 01/21/2017, 11/22/2017  . Janssen (J&J) SARS-COV-2 Vaccination 07/29/2019  . Pneumococcal Polysaccharide-23 11/12/2017      Review of Systems as above: See pertinent positives and pertinent negatives per HPI No acute distress verbally   Observations/Objective/Exam:  An attempt was made to discern vital signs over the phone and per patient if applicable and possible.   General:    Alert, Oriented, appears well and in no acute distress  Pulmonary:     On inspection no signs of respiratory distress.  Psych / Neurological:     Pleasant and cooperative.  Assessment and Plan:    ICD-10-CM   1. Acute non-recurrent maxillary sinusitis  J01.00   2. Close exposure to COVID-19 virus  Z20.822    By history, this sounds like she does have an acute sinusitis.  Treat as such.  With her husband having positive COVID-19, she already did have one Covid test on Monday, but I asked her to repeat this and she indicated that she would.  I discussed the assessment and treatment plan with the patient. The patient was provided an opportunity to ask questions and all were answered. The patient agreed with the plan and demonstrated an understanding of the instructions.   The patient was advised to call back or seek an in-person evaluation if the symptoms worsen or if the condition fails to improve as anticipated.  Follow-up: prn unless noted otherwise below No follow-ups on file.  Meds ordered this encounter  Medications  . amoxicillin (AMOXIL) 500 MG capsule  Sig: Take 2 capsules (1,000 mg total) by mouth 2 (two) times daily for 10 days.    Dispense:  40 capsule    Refill:  0   No orders of the defined types were placed in this encounter.   Signed,  Elpidio Galea. Cornelius Schuitema, MD

## 2020-01-26 LAB — NOVEL CORONAVIRUS, NAA: SARS-CoV-2, NAA: DETECTED — AB

## 2020-01-26 LAB — SARS-COV-2, NAA 2 DAY TAT

## 2020-01-27 ENCOUNTER — Other Ambulatory Visit: Payer: Self-pay | Admitting: Nurse Practitioner

## 2020-01-27 DIAGNOSIS — E119 Type 2 diabetes mellitus without complications: Secondary | ICD-10-CM

## 2020-01-27 DIAGNOSIS — U071 COVID-19: Secondary | ICD-10-CM

## 2020-01-27 NOTE — Progress Notes (Signed)
I connected by phone with Jaime Gilmore on 01/27/2020 at 12:44 PM to discuss the potential use of a new treatment for mild to moderate COVID-19 viral infection in non-hospitalized patients.  This patient is a 44 y.o. female that meets the FDA criteria for Emergency Use Authorization of COVID monoclonal antibody casirivimab/imdevimab or bamlanivimab/eteseviamb.  Has a (+) direct SARS-CoV-2 viral test result  Has mild or moderate COVID-19   Is NOT hospitalized due to COVID-19  Is within 10 days of symptom onset  Has at least one of the high risk factor(s) for progression to severe COVID-19 and/or hospitalization as defined in EUA.  Specific high risk criteria : BMI > 25 and Diabetes   I have spoken and communicated the following to the patient or parent/caregiver regarding COVID monoclonal antibody treatment:  1. FDA has authorized the emergency use for the treatment of mild to moderate COVID-19 in adults and pediatric patients with positive results of direct SARS-CoV-2 viral testing who are 2 years of age and older weighing at least 40 kg, and who are at high risk for progressing to severe COVID-19 and/or hospitalization.  2. The significant known and potential risks and benefits of COVID monoclonal antibody, and the extent to which such potential risks and benefits are unknown.  3. Information on available alternative treatments and the risks and benefits of those alternatives, including clinical trials.  4. Patients treated with COVID monoclonal antibody should continue to self-isolate and use infection control measures (e.g., wear mask, isolate, social distance, avoid sharing personal items, clean and disinfect "high touch" surfaces, and frequent handwashing) according to CDC guidelines.   5. The patient or parent/caregiver has the option to accept or refuse COVID monoclonal antibody treatment.  After reviewing this information with the patient, the patient has agreed to receive one  of the available covid 19 monoclonal antibodies and will be provided an appropriate fact sheet prior to infusion. Mayra Reel, NP 01/27/2020 12:44 PM

## 2020-01-28 ENCOUNTER — Ambulatory Visit (HOSPITAL_COMMUNITY)
Admission: RE | Admit: 2020-01-28 | Discharge: 2020-01-28 | Disposition: A | Discharge status: Home / Self Care | Payer: BC Managed Care – PPO | Source: Ambulatory Visit | Attending: Pulmonary Disease | Admitting: Pulmonary Disease

## 2020-01-28 DIAGNOSIS — U071 COVID-19: Secondary | ICD-10-CM | POA: Insufficient documentation

## 2020-01-28 DIAGNOSIS — E119 Type 2 diabetes mellitus without complications: Secondary | ICD-10-CM

## 2020-01-28 MED ORDER — SODIUM CHLORIDE 0.9 % IV SOLN: Freq: Once | INTRAVENOUS | Status: AC

## 2020-01-28 MED ORDER — METHYLPREDNISOLONE SODIUM SUCC 125 MG IJ SOLR: 125.0000 mg | Freq: Once | INTRAMUSCULAR | Status: DC | PRN

## 2020-01-28 MED ORDER — DIPHENHYDRAMINE HCL 50 MG/ML IJ SOLN: 50.0000 mg | Freq: Once | INTRAMUSCULAR | Status: DC | PRN

## 2020-01-28 MED ORDER — EPINEPHRINE 0.3 MG/0.3ML IJ SOAJ: 0.3000 mg | Freq: Once | INTRAMUSCULAR | Status: DC | PRN

## 2020-01-28 MED ORDER — FAMOTIDINE IN NACL 20-0.9 MG/50ML-% IV SOLN: 20.0000 mg | Freq: Once | INTRAVENOUS | Status: DC | PRN

## 2020-01-28 MED ORDER — ALBUTEROL SULFATE HFA 108 (90 BASE) MCG/ACT IN AERS: 2.0000 | INHALATION_SPRAY | Freq: Once | RESPIRATORY_TRACT | Status: DC | PRN

## 2020-01-28 MED ORDER — SODIUM CHLORIDE 0.9 % IV SOLN: INTRAVENOUS | Status: DC | PRN

## 2020-01-28 NOTE — Progress Notes (Signed)
  Diagnosis: COVID-19  Physician:dr wright  Procedure: [facility] Med: bamlanivimab\etesevimab infusion - Provided patient with bamlanimivab\etesevimab fact sheet for patients, parents and caregivers prior to infusion.  Complications: No immediate complications noted.  Discharge: Discharged home   Kalinda Romaniello S Domenic Schoenberger 01/28/2020  

## 2020-01-28 NOTE — Discharge Instructions (Signed)
COVID-19 COVID-19 is a respiratory infection that is caused by a virus called severe acute respiratory syndrome coronavirus 2 (SARS-CoV-2). The disease is also known as coronavirus disease or novel coronavirus. In some people, the virus may not cause any symptoms. In others, it may cause a serious infection. The infection can get worse quickly and can lead to complications, such as:  Pneumonia, or infection of the lungs.  Acute respiratory distress syndrome or ARDS. This is a condition in which fluid build-up in the lungs prevents the lungs from filling with air and passing oxygen into the blood.  Acute respiratory failure. This is a condition in which there is not enough oxygen passing from the lungs to the body or when carbon dioxide is not passing from the lungs out of the body.  Sepsis or septic shock. This is a serious bodily reaction to an infection.  Blood clotting problems.  Secondary infections due to bacteria or fungus.  Organ failure. This is when your body's organs stop working. The virus that causes COVID-19 is contagious. This means that it can spread from person to person through droplets from coughs and sneezes (respiratory secretions). What are the causes? This illness is caused by a virus. You may catch the virus by:  Breathing in droplets from an infected person. Droplets can be spread by a person breathing, speaking, singing, coughing, or sneezing.  Touching something, like a table or a doorknob, that was exposed to the virus (contaminated) and then touching your mouth, nose, or eyes. What increases the risk? Risk for infection You are more likely to be infected with this virus if you:  Are within 6 feet (2 meters) of a person with COVID-19.  Provide care for or live with a person who is infected with COVID-19.  Spend time in crowded indoor spaces or live in shared housing. Risk for serious illness You are more likely to become seriously ill from the virus if  you:  Are 50 years of age or older. The higher your age, the more you are at risk for serious illness.  Live in a nursing home or long-term care facility.  Have cancer.  Have a long-term (chronic) disease such as: ? Chronic lung disease, including chronic obstructive pulmonary disease or asthma. ? A long-term disease that lowers your body's ability to fight infection (immunocompromised). ? Heart disease, including heart failure, a condition in which the arteries that lead to the heart become narrow or blocked (coronary artery disease), a disease which makes the heart muscle thick, weak, or stiff (cardiomyopathy). ? Diabetes. ? Chronic kidney disease. ? Sickle cell disease, a condition in which red blood cells have an abnormal "sickle" shape. ? Liver disease.  Are obese. What are the signs or symptoms? Symptoms of this condition can range from mild to severe. Symptoms may appear any time from 2 to 14 days after being exposed to the virus. They include:  A fever or chills.  A cough.  Difficulty breathing.  Headaches, body aches, or muscle aches.  Runny or stuffy (congested) nose.  A sore throat.  New loss of taste or smell. Some people may also have stomach problems, such as nausea, vomiting, or diarrhea. Other people may not have any symptoms of COVID-19. How is this diagnosed? This condition may be diagnosed based on:  Your signs and symptoms, especially if: ? You live in an area with a COVID-19 outbreak. ? You recently traveled to or from an area where the virus is common. ? You   provide care for or live with a person who was diagnosed with COVID-19. ? You were exposed to a person who was diagnosed with COVID-19.  A physical exam.  Lab tests, which may include: ? Taking a sample of fluid from the back of your nose and throat (nasopharyngeal fluid), your nose, or your throat using a swab. ? A sample of mucus from your lungs (sputum). ? Blood tests.  Imaging tests,  which may include, X-rays, CT scan, or ultrasound. How is this treated? At present, there is no medicine to treat COVID-19. Medicines that treat other diseases are being used on a trial basis to see if they are effective against COVID-19. Your health care provider will talk with you about ways to treat your symptoms. For most people, the infection is mild and can be managed at home with rest, fluids, and over-the-counter medicines. Treatment for a serious infection usually takes places in a hospital intensive care unit (ICU). It may include one or more of the following treatments. These treatments are given until your symptoms improve.  Receiving fluids and medicines through an IV.  Supplemental oxygen. Extra oxygen is given through a tube in the nose, a face mask, or a hood.  Positioning you to lie on your stomach (prone position). This makes it easier for oxygen to get into the lungs.  Continuous positive airway pressure (CPAP) or bi-level positive airway pressure (BPAP) machine. This treatment uses mild air pressure to keep the airways open. A tube that is connected to a motor delivers oxygen to the body.  Ventilator. This treatment moves air into and out of the lungs by using a tube that is placed in your windpipe.  Tracheostomy. This is a procedure to create a hole in the neck so that a breathing tube can be inserted.  Extracorporeal membrane oxygenation (ECMO). This procedure gives the lungs a chance to recover by taking over the functions of the heart and lungs. It supplies oxygen to the body and removes carbon dioxide. Follow these instructions at home: Lifestyle  If you are sick, stay home except to get medical care. Your health care provider will tell you how long to stay home. Call your health care provider before you go for medical care.  Rest at home as told by your health care provider.  Do not use any products that contain nicotine or tobacco, such as cigarettes,  e-cigarettes, and chewing tobacco. If you need help quitting, ask your health care provider.  Return to your normal activities as told by your health care provider. Ask your health care provider what activities are safe for you. General instructions  Take over-the-counter and prescription medicines only as told by your health care provider.  Drink enough fluid to keep your urine pale yellow.  Keep all follow-up visits as told by your health care provider. This is important. How is this prevented?  There is no vaccine to help prevent COVID-19 infection. However, there are steps you can take to protect yourself and others from this virus. To protect yourself:   Do not travel to areas where COVID-19 is a risk. The areas where COVID-19 is reported change often. To identify high-risk areas and travel restrictions, check the CDC travel website: wwwnc.cdc.gov/travel/notices  If you live in, or must travel to, an area where COVID-19 is a risk, take precautions to avoid infection. ? Stay away from people who are sick. ? Wash your hands often with soap and water for 20 seconds. If soap and water   are not available, use an alcohol-based hand sanitizer. ? Avoid touching your mouth, face, eyes, or nose. ? Avoid going out in public, follow guidance from your state and local health authorities. ? If you must go out in public, wear a cloth face covering or face mask. Make sure your mask covers your nose and mouth. ? Avoid crowded indoor spaces. Stay at least 6 feet (2 meters) away from others. ? Disinfect objects and surfaces that are frequently touched every day. This may include:  Counters and tables.  Doorknobs and light switches.  Sinks and faucets.  Electronics, such as phones, remote controls, keyboards, computers, and tablets. To protect others: If you have symptoms of COVID-19, take steps to prevent the virus from spreading to others.  If you think you have a COVID-19 infection, contact  your health care provider right away. Tell your health care team that you think you may have a COVID-19 infection.  Stay home. Leave your house only to seek medical care. Do not use public transport.  Do not travel while you are sick.  Wash your hands often with soap and water for 20 seconds. If soap and water are not available, use alcohol-based hand sanitizer.  Stay away from other members of your household. Let healthy household members care for children and pets, if possible. If you have to care for children or pets, wash your hands often and wear a mask. If possible, stay in your own room, separate from others. Use a different bathroom.  Make sure that all people in your household wash their hands well and often.  Cough or sneeze into a tissue or your sleeve or elbow. Do not cough or sneeze into your hand or into the air.  Wear a cloth face covering or face mask. Make sure your mask covers your nose and mouth. Where to find more information  Centers for Disease Control and Prevention: www.cdc.gov/coronavirus/2019-ncov/index.html  World Health Organization: www.who.int/health-topics/coronavirus Contact a health care provider if:  You live in or have traveled to an area where COVID-19 is a risk and you have symptoms of the infection.  You have had contact with someone who has COVID-19 and you have symptoms of the infection. Get help right away if:  You have trouble breathing.  You have pain or pressure in your chest.  You have confusion.  You have bluish lips and fingernails.  You have difficulty waking from sleep.  You have symptoms that get worse. These symptoms may represent a serious problem that is an emergency. Do not wait to see if the symptoms will go away. Get medical help right away. Call your local emergency services (911 in the [place] Do not drive yourself to the hospital. Let the emergency medical personnel know if you think you have  COVID-19. Summary  COVID-19 is a respiratory infection that is caused by a virus. It is also known as coronavirus disease or novel coronavirus. It can cause serious infections, such as pneumonia, acute respiratory distress syndrome, acute respiratory failure, or sepsis.  The virus that causes COVID-19 is contagious. This means that it can spread from person to person through droplets from breathing, speaking, singing, coughing, or sneezing.  You are more likely to develop a serious illness if you are 50 years of age or older, have a weak immune system, live in a nursing home, or have chronic disease.  There is no medicine to treat COVID-19. Your health care provider will talk with you about ways to treat your symptoms.    Take steps to protect yourself and others from infection. Wash your hands often and disinfect objects and surfaces that are frequently touched every day. Stay away from people who are sick and wear a mask if you are sick. This information is not intended to replace advice given to you by your health care provider. Make sure you discuss any questions you have with your health care provider. Document Revised: 02/03/2019 Document Reviewed: 05/12/2018 Elsevier Patient Education  2020 Elsevier Inc. What types of side effects do monoclonal antibody drugs cause?  Common side effects  In general, the more common side effects caused by monoclonal antibody drugs include: . Allergic reactions, such as hives or itching . Flu-like signs and symptoms, including chills, fatigue, fever, and muscle aches and pains . Nausea, vomiting . Diarrhea . Skin rashes . Low blood pressure   The CDC is recommending patients who receive monoclonal antibody treatments wait at least 90 days before being vaccinated.  Currently, there are no data on the safety and efficacy of mRNA COVID-19 vaccines in persons who received monoclonal antibodies or convalescent plasma as part of COVID-19 treatment. Based  on the estimated half-life of such therapies as well as evidence suggesting that reinfection is uncommon in the 90 days after initial infection, vaccination should be deferred for at least 90 days, as a precautionary measure until additional information becomes available, to avoid interference of the antibody treatment with vaccine-induced immune responses. 

## 2020-01-30 ENCOUNTER — Ambulatory Visit: Payer: BC Managed Care – PPO | Admitting: Primary Care

## 2020-02-05 ENCOUNTER — Encounter: Payer: Self-pay | Admitting: Primary Care

## 2020-02-05 ENCOUNTER — Other Ambulatory Visit: Payer: Self-pay

## 2020-02-05 ENCOUNTER — Telehealth (INDEPENDENT_AMBULATORY_CARE_PROVIDER_SITE_OTHER): Payer: BC Managed Care – PPO | Admitting: Primary Care

## 2020-02-05 DIAGNOSIS — E119 Type 2 diabetes mellitus without complications: Secondary | ICD-10-CM

## 2020-02-05 DIAGNOSIS — U071 COVID-19: Secondary | ICD-10-CM | POA: Insufficient documentation

## 2020-02-05 DIAGNOSIS — F411 Generalized anxiety disorder: Secondary | ICD-10-CM

## 2020-02-05 DIAGNOSIS — G8929 Other chronic pain: Secondary | ICD-10-CM

## 2020-02-05 DIAGNOSIS — E785 Hyperlipidemia, unspecified: Secondary | ICD-10-CM

## 2020-02-05 DIAGNOSIS — M545 Low back pain, unspecified: Secondary | ICD-10-CM

## 2020-02-05 DIAGNOSIS — E1169 Type 2 diabetes mellitus with other specified complication: Secondary | ICD-10-CM | POA: Diagnosis not present

## 2020-02-05 DIAGNOSIS — K219 Gastro-esophageal reflux disease without esophagitis: Secondary | ICD-10-CM

## 2020-02-05 HISTORY — DX: COVID-19: U07.1

## 2020-02-05 NOTE — Assessment & Plan Note (Signed)
Compliant to atorvastatin, repeat lipid panel pending. 

## 2020-02-05 NOTE — Assessment & Plan Note (Signed)
Compliant to atorvastatin. Repeat lipid panel pending.  

## 2020-02-05 NOTE — Progress Notes (Signed)
Subjective:    Patient ID: Jaime Gilmore, female    DOB: December 12, 1975, 44 y.o.   MRN: [id number]  HPI  Virtual Visit via Video Note  I connected with Jaime Gilmore on 02/05/20 at  8:20 AM EDT by a video enabled telemedicine application and verified that I am speaking with the correct person using two identifiers.  Location: Patient: Home Provider: Office Participants: Patient and myself   I discussed the limitations of evaluation and management by telemedicine and the availability of in person appointments. The patient expressed understanding and agreed to proceed.  History of Present Illness:  Jaime Gilmore is a 45 year old female with a history of type 2 diabetes, GERD, PCOS, hyperlipidemia, obesity, GAD who presents today for follow up. She is also requesting a handicap placard.   1) Covid-19 Infection: Diagnosed on 01/27/20, husband positive a few days prior. She underwent underwent monoclonal antibody treatment 01/27/20. She continues to struggle with Covid-19 symptoms but is feeling better at times. Symptoms now include fatigue, loss of taste/smell.  She is technically done with quarantine, but would like a few extra days to continue to recover.  She would like to return to work on 10/21 and will need a work note.  2) Type 2 Diabetes:  She does not check her glucose. She is compliant to her Metformin XR BID.  Last A1c was 8.2 in March 2021.  She is interested in trying [place] or Victoza for weight loss and diabetes control.  3) GAD: Overall she's doing well on Lexapro daily and Bupropion 5 mg BID. She is prescribed alprazolam by her GYN for which she uses sparingly. If she does use this, she will cut this into quarters. 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 She avoids stairs during most circumstances.  She has not spoken to her therapist regarding this.  4) GERD: She is doing well on famotidine 20 mg for which she takes daily with  significant improvement.   5) Obesity/Back Pain: Intermittent lower back pain with weakness to lower extremities for quite some time.  She has to rely on a shopping cart to lean against when shopping in the store.  She understands that her weight affects her pain and is motivated to lose weight.  She would like a temporary handicap placard due to chronic low back pain, numbness of legs when walking.    Observations/Objective:  Alert and oriented. Appears well, not sickly. No distress. Speaking in complete sentences.   Assessment and Plan:  See problem based charting.  Follow Up Instructions:  Call the main office to set up a lab appointment as discussed.  I will be in touch with results as soon as received.  I will attach your work note to your MyChart account.  Your handicap placard can be picked up tomorrow at 8 AM or later.  It was a pleasure to see you today! Allie Bossier, NP-C    I discussed the assessment and treatment plan with the patient. The patient was provided an opportunity to ask questions and all were answered. The patient agreed with the plan and demonstrated an understanding of the instructions.   The patient was advised to call back or seek an in-person evaluation if the symptoms worsen or if the condition fails to improve as anticipated.    Pleas Koch, NP    Review of Systems  Constitutional: Positive for fatigue. Negative for fever.  Respiratory:  Positive for cough.   Cardiovascular: Negative for chest pain.  Musculoskeletal: Positive for arthralgias and back pain.  Psychiatric/Behavioral: The patient is not nervous/anxious.        Past Medical History:  Diagnosis Date  . Anxiety   . BRCA gene mutation negative in female 06/2015  . Cervical high risk HPV (human papillomavirus) test positive 05/15/09;05/22/10;12/16/10;2014   hpv pos  . Dysmenorrhea   . Family history of breast cancer 06/2015  . History of mammogram 02/19/2015   BIRAD I  .  History of Papanicolaou smear of cervix 06/25/2015   RNIL;NEG  . Hyperlipidemia   . Increased risk of breast cancer 08/2015   IBIS=24%  . Insomnia   . Onychomycosis 06/12/2014  . Pap smear abnormality of cervix with ASCUS favoring dysplasia 05/22/10;12/16/10;11/27/11;05/30/12  . Pap smear abnormality of cervix with LGSIL 05/15/2009  . Polycystic ovaries   . Type 2 diabetes mellitus ([place])   . Vitamin D deficiency      Social History   Socioeconomic History  . Marital status: Married    Spouse name: Not on file  . Number of children: 0  . Years of education: 30  . Highest education level: Not on file  Occupational History  . Occupation: business  Tobacco Use  . Smoking status: Current Every Day Smoker    Packs/day: 1.00  . Smokeless tobacco: Never Used  Vaping Use  . Vaping Use: Never used  Substance and Sexual Activity  . Alcohol use: No  . Drug use: No  . Sexual activity: Yes    Birth control/protection: None  Other Topics Concern  . Not on file  Social History Narrative   Married.   No children.    Works in [place].   Enjoys watching movies, swimming.    Social Determinants of Health   Financial Resource Strain:   . Difficulty of Paying Living Expenses: Not on file  Food Insecurity:   . Worried About Charity fundraiser in the Last Year: Not on file  . Ran Out of Food in the Last Year: Not on file  Transportation Needs:   . Lack of Transportation (Medical): Not on file  . Lack of Transportation (Non-Medical): Not on file  Physical Activity:   . Days of Exercise per Week: Not on file  . Minutes of Exercise per Session: Not on file  Stress:   . Feeling of Stress : Not on file  Social Connections:   . Frequency of Communication with Friends and Family: Not on file  . Frequency of Social Gatherings with Friends and Family: Not on file  . Attends Religious Services: Not on file  . Active Member of Clubs or Organizations: Not on file  . Attends Archivist  Meetings: Not on file  . Marital Status: Not on file  Intimate Partner Violence:   . Fear of Current or Ex-Partner: Not on file  . Emotionally Abused: Not on file  . Physically Abused: Not on file  . Sexually Abused: Not on file    Past Surgical History:  Procedure Laterality Date  . CERVICAL BIOPSY  W/ LOOP ELECTRODE EXCISION  06/2012   PATH NEG  . COLPOSCOPY  05/15/09;2/17;12/3; 5/13   NO LESIONS  . INTRAUTERINE DEVICE (IUD) INSERTION  08/2007  . IUD REMOVAL  05/30/2012  . LAPAROSCOPIC CHOLECYSTECTOMY  2004  . SKIN LESION EXCISION  2007   DERMATOFIBROMA    Family History  Problem Relation Age of Onset  . Breast cancer Maternal  Aunt 40       no contact  . Breast cancer Mother 50  . Anemia Mother   . High Cholesterol Mother   . Hypertension Father   . Cancer Maternal Uncle        pancreatic  . Diabetes Maternal Grandmother   . Hypertension Paternal Grandmother   . Hearing loss Paternal Grandmother   . Heart disease Paternal Grandmother   . High Cholesterol Paternal Grandmother   . Cancer Cousin        ovarian- cured, no contact  . Hearing loss Paternal Grandfather   . Early death Paternal Grandfather     No Known Allergies  Current Outpatient Medications on File Prior to Visit  Medication Sig Dispense Refill  . ALPRAZolam (XANAX) 0.5 MG tablet Take 1 tablet (0.5 mg total) by mouth daily as needed for anxiety. Use sparingly. 30 tablet 0  . atorvastatin (LIPITOR) 10 MG tablet TAKE 1 TABLET BY MOUTH EVERY DAY 90 tablet 1  . busPIRone (BUSPAR) 5 MG tablet TAKE 1 TABLET (5 MG TOTAL) BY MOUTH 2 (TWO) TIMES DAILY. FOR ANXIETY. 180 tablet 1  . escitalopram (LEXAPRO) 20 MG tablet TAKE 1 TABLET (20 MG TOTAL) BY MOUTH DAILY. FOR ANXIETY. 90 tablet 0  . famotidine (PEPCID) 20 MG tablet TAKE 1 TABLET (20 MG TOTAL) BY MOUTH 2 (TWO) TIMES DAILY. FOR HEARTBURN. 180 tablet 1  . fluticasone (FLONASE) 50 MCG/ACT nasal spray PLACE 1 SPRAY INTO BOTH NOSTRILS 2 (TWO) TIMES DAILY. 16 g 2    . lisinopril (ZESTRIL) 5 MG tablet TAKE 1 TABLET BY MOUTH EVERY DAY 90 tablet 1  . metFORMIN (GLUCOPHAGE-XR) 500 MG 24 hr tablet Take 2 tablets (1,000 mg total) by mouth daily with breakfast. For diabetes. 180 tablet 3   No current facility-administered medications on file prior to visit.    Ht _0  (1.549 m)   Wt 296 lb (134.3 kg)   LMP 01/09/2020   BMI 55.93 kg/m    Objective:   Physical Exam Constitutional:      Appearance: Normal appearance.  Pulmonary:     Effort: Pulmonary effort is normal.     Comments: Cough noted several times during visit. Neurological:     Mental Status: She is alert and oriented to person, place, and time.  Psychiatric:        Mood and Affect: Mood normal.            Assessment & Plan:

## 2020-02-05 NOTE — Assessment & Plan Note (Signed)
Doing well on Lexapro and buspirone daily. Uses Xanax infrequently. Continue current regimen.

## 2020-02-05 NOTE — Assessment & Plan Note (Signed)
Well-controlled on famotidine 20 mg.  Continue same.

## 2020-02-05 NOTE — Assessment & Plan Note (Signed)
Treated with monoclonal antibody fusion, slower to recover, needs a few more days from work.  Agree to keep patient out of work for another couple of days with a return date of 02/08/2020.  We will attach note to my chart.

## 2020-02-05 NOTE — Assessment & Plan Note (Signed)
Chronic for some time, limited ability to walk for prolonged periods of time.  Agree that weight loss would improve symptoms of pain.  Strongly encouraged to work on her diet. Agree to provide a temporary handicap placard of 4 months.

## 2020-02-05 NOTE — Assessment & Plan Note (Addendum)
Uncontrolled as of last A1c test.  Repeat A1c pending.  Agreed to consider Victoza or Saxenda for diabetes control and weight loss.  We will await her A1c result first.  For now continue Metformin. Managed on statin and ACE inhibitor.  Follow-up in 3 months if we initiate daily injectable treatment such as Victoza.

## 2020-02-28 ENCOUNTER — Encounter: Payer: Self-pay | Admitting: Primary Care

## 2020-02-28 ENCOUNTER — Other Ambulatory Visit: Payer: Self-pay

## 2020-02-28 ENCOUNTER — Other Ambulatory Visit (INDEPENDENT_AMBULATORY_CARE_PROVIDER_SITE_OTHER): Payer: BC Managed Care – PPO

## 2020-02-28 DIAGNOSIS — E785 Hyperlipidemia, unspecified: Secondary | ICD-10-CM | POA: Diagnosis not present

## 2020-02-28 DIAGNOSIS — E119 Type 2 diabetes mellitus without complications: Secondary | ICD-10-CM

## 2020-02-28 LAB — COMPREHENSIVE METABOLIC PANEL
ALT: 19 U/L (ref 0–35)
AST: 13 U/L (ref 0–37)
Albumin: 4 g/dL (ref 3.5–5.2)
Alkaline Phosphatase: 88 U/L (ref 39–117)
BUN: 13 mg/dL (ref 6–23)
CO2: 26 mEq/L (ref 19–32)
Calcium: 9.5 mg/dL (ref 8.4–10.5)
Chloride: 101 mEq/L (ref 96–112)
Creatinine, Ser: 0.68 mg/dL (ref 0.40–1.20)
GFR: 104.3 mL/min (ref >60.00)
Glucose, Bld: 181 mg/dL — ABNORMAL HIGH (ref 70–99)
Potassium: 5 mEq/L (ref 3.5–5.1)
Sodium: 135 mEq/L (ref 135–145)
Total Bilirubin: 0.2 mg/dL (ref 0.2–1.2)
Total Protein: 6.9 g/dL (ref 6.0–8.3)

## 2020-02-28 LAB — CBC
HCT: 40 % (ref 36.0–46.0)
Hemoglobin: 13.4 g/dL (ref 12.0–15.0)
MCHC: 33.6 g/dL (ref 30.0–36.0)
MCV: 92.2 fl (ref 78.0–100.0)
Platelets: 349 10*3/uL (ref 150.0–400.0)
RBC: 4.33 Mil/uL (ref 3.87–5.11)
RDW: 13.6 % (ref 11.5–15.5)
WBC: 10.3 10*3/uL (ref 4.0–10.5)

## 2020-02-28 LAB — LIPID PANEL
Cholesterol: 135 mg/dL (ref 0–200)
HDL: 38.5 mg/dL — ABNORMAL LOW (ref >39.00)
NonHDL: 96.19
Total CHOL/HDL Ratio: 3
Triglycerides: 207 mg/dL — ABNORMAL HIGH (ref 0.0–149.0)
VLDL: 41.4 mg/dL — ABNORMAL HIGH (ref 0.0–40.0)

## 2020-02-28 LAB — HEMOGLOBIN A1C: Hgb A1c MFr Bld: 8.2 % — ABNORMAL HIGH (ref 4.6–6.5)

## 2020-02-28 LAB — LDL CHOLESTEROL, DIRECT: Direct LDL: 66 mg/dL

## 2020-02-29 ENCOUNTER — Other Ambulatory Visit: Payer: Self-pay | Admitting: Primary Care

## 2020-02-29 DIAGNOSIS — E119 Type 2 diabetes mellitus without complications: Secondary | ICD-10-CM

## 2020-02-29 MED ORDER — LIRAGLUTIDE 18 MG/3ML ~~LOC~~ SOPN: 1.8000 mg | PEN_INJECTOR | Freq: Every day | SUBCUTANEOUS | 3 refills | Status: DC

## 2020-03-02 MED ORDER — ONETOUCH ULTRA VI STRP: 1.0000 | ORAL_STRIP | Freq: Two times a day (BID) | 1 refills | Status: DC | PRN

## 2020-03-02 MED ORDER — LANCETS 30G MISC: 1.0000 | Freq: Two times a day (BID) | 1 refills | Status: DC | PRN

## 2020-03-02 MED ORDER — CARETOUCH PEN NEEDLES 31G X 5 MM MISC: 1.0000 | Freq: Every day | 1 refills | Status: DC

## 2020-03-14 ENCOUNTER — Other Ambulatory Visit: Payer: Self-pay | Admitting: Primary Care

## 2020-03-14 DIAGNOSIS — E1169 Type 2 diabetes mellitus with other specified complication: Secondary | ICD-10-CM

## 2020-03-14 DIAGNOSIS — E119 Type 2 diabetes mellitus without complications: Secondary | ICD-10-CM

## 2020-03-14 DIAGNOSIS — E785 Hyperlipidemia, unspecified: Secondary | ICD-10-CM

## 2020-03-31 ENCOUNTER — Other Ambulatory Visit: Payer: Self-pay | Admitting: Primary Care

## 2020-03-31 DIAGNOSIS — F411 Generalized anxiety disorder: Secondary | ICD-10-CM

## 2020-04-23 ENCOUNTER — Ambulatory Visit: Payer: BC Managed Care – PPO | Admitting: Obstetrics and Gynecology

## 2020-04-26 ENCOUNTER — Other Ambulatory Visit: Payer: Self-pay | Admitting: Primary Care

## 2020-04-26 DIAGNOSIS — F411 Generalized anxiety disorder: Secondary | ICD-10-CM

## 2020-04-27 ENCOUNTER — Other Ambulatory Visit: Payer: Self-pay | Admitting: Primary Care

## 2020-04-27 DIAGNOSIS — F411 Generalized anxiety disorder: Secondary | ICD-10-CM

## 2020-05-15 ENCOUNTER — Encounter: Payer: Self-pay | Admitting: Primary Care

## 2020-05-15 ENCOUNTER — Other Ambulatory Visit: Payer: Self-pay

## 2020-05-15 ENCOUNTER — Ambulatory Visit: Payer: BC Managed Care – PPO | Admitting: Primary Care

## 2020-05-15 VITALS — BP 124/76 | HR 102 | Temp 98.6°F | Ht 61.0 in | Wt 273.0 lb

## 2020-05-15 DIAGNOSIS — Z1159 Encounter for screening for other viral diseases: Secondary | ICD-10-CM | POA: Diagnosis not present

## 2020-05-15 DIAGNOSIS — E1169 Type 2 diabetes mellitus with other specified complication: Secondary | ICD-10-CM

## 2020-05-15 DIAGNOSIS — E785 Hyperlipidemia, unspecified: Secondary | ICD-10-CM | POA: Diagnosis not present

## 2020-05-15 DIAGNOSIS — G4700 Insomnia, unspecified: Secondary | ICD-10-CM

## 2020-05-15 DIAGNOSIS — Z9189 Other specified personal risk factors, not elsewhere classified: Secondary | ICD-10-CM

## 2020-05-15 DIAGNOSIS — E119 Type 2 diabetes mellitus without complications: Secondary | ICD-10-CM

## 2020-05-15 DIAGNOSIS — Z114 Encounter for screening for human immunodeficiency virus [HIV]: Secondary | ICD-10-CM

## 2020-05-15 DIAGNOSIS — Z1231 Encounter for screening mammogram for malignant neoplasm of breast: Secondary | ICD-10-CM | POA: Diagnosis not present

## 2020-05-15 DIAGNOSIS — F411 Generalized anxiety disorder: Secondary | ICD-10-CM

## 2020-05-15 LAB — LIPID PANEL
Cholesterol: 102 mg/dL (ref 0–200)
HDL: 33.1 mg/dL — ABNORMAL LOW (ref >39.00)
NonHDL: 68.63
Total CHOL/HDL Ratio: 3
Triglycerides: 229 mg/dL — ABNORMAL HIGH (ref 0.0–149.0)
VLDL: 45.8 mg/dL — ABNORMAL HIGH (ref 0.0–40.0)

## 2020-05-15 LAB — COMPREHENSIVE METABOLIC PANEL
ALT: 27 U/L (ref 0–35)
AST: 15 U/L (ref 0–37)
Albumin: 4.3 g/dL (ref 3.5–5.2)
Alkaline Phosphatase: 86 U/L (ref 39–117)
BUN: 12 mg/dL (ref 6–23)
CO2: 28 mEq/L (ref 19–32)
Calcium: 9.7 mg/dL (ref 8.4–10.5)
Chloride: 100 mEq/L (ref 96–112)
Creatinine, Ser: 0.72 mg/dL (ref 0.40–1.20)
GFR: 99.99 mL/min (ref >60.00)
Glucose, Bld: 156 mg/dL — ABNORMAL HIGH (ref 70–99)
Potassium: 4.7 mEq/L (ref 3.5–5.1)
Sodium: 135 mEq/L (ref 135–145)
Total Bilirubin: 0.4 mg/dL (ref 0.2–1.2)
Total Protein: 7.7 g/dL (ref 6.0–8.3)

## 2020-05-15 LAB — POCT GLYCOSYLATED HEMOGLOBIN (HGB A1C): Hemoglobin A1C: 6.7 % — AB (ref 4.0–5.6)

## 2020-05-15 LAB — LDL CHOLESTEROL, DIRECT: Direct LDL: 41 mg/dL

## 2020-05-15 MED ORDER — TRAZODONE HCL 50 MG PO TABS: 50.0000 mg | ORAL_TABLET | Freq: Every evening | ORAL | 0 refills | Status: DC | PRN

## 2020-05-15 MED ORDER — BUSPIRONE HCL 5 MG PO TABS: 7.5000 mg | ORAL_TABLET | Freq: Two times a day (BID) | ORAL | 0 refills | Status: DC

## 2020-05-15 MED ORDER — LIRAGLUTIDE 18 MG/3ML ~~LOC~~ SOPN: 1.8000 mg | PEN_INJECTOR | Freq: Every day | SUBCUTANEOUS | 1 refills | Status: DC

## 2020-05-15 NOTE — Patient Instructions (Addendum)
Stop by the lab prior to leaving today. I will notify you of your results once received.   We increased your buspirone to 7.5 mg twice daily, you can take 1 and 1/2 tablets of your current bottle. Please update me.  Start Trazodone 50 mg at bedtime for sleep. Please update me.   Continue Victoza. Continue to work on a healthy diet!  [facility] to schedule your mammogram.   Please schedule a follow up appointment in 3 months for diabetes check.  It was a pleasure to see you today! .   Tdap (Tetanus, Diphtheria, Pertussis) Vaccine: What You Need to Know 1. Why get vaccinated? Tdap vaccine can prevent tetanus, diphtheria, and pertussis. Diphtheria and pertussis spread from person to person. Tetanus enters the body through cuts or wounds.  TETANUS (T) causes painful stiffening of the muscles. Tetanus can lead to serious health problems, including being unable to open the mouth, having trouble swallowing and breathing, or death.  DIPHTHERIA (D) can lead to difficulty breathing, heart failure, paralysis, or death.  PERTUSSIS (aP), also known as "whooping cough," can cause uncontrollable, violent coughing that makes it hard to breathe, eat, or drink. Pertussis can be extremely serious especially in babies and young children, causing pneumonia, convulsions, brain damage, or death. In teens and adults, it can cause weight loss, loss of bladder control, passing out, and rib fractures from severe coughing. 2. Tdap vaccine Tdap is only for children 7 years and older, adolescents, and adults.  Adolescents should receive a single dose of Tdap, preferably at age 48 or 12 years. Pregnant people should get a dose of Tdap during every pregnancy, preferably during the early part of the third trimester, to help protect the newborn from pertussis. Infants are most at risk for severe, life-threatening complications from pertussis. Adults who have never received Tdap should get a dose of  Tdap. Also, adults should receive a booster dose of either Tdap or Td (a different vaccine that protects against tetanus and diphtheria but not pertussis) every 10 years, or after 5 years in the case of a severe or dirty wound or burn. Tdap may be given at the same time as other vaccines. 3. Talk with your health care provider Tell your vaccine provider if the person getting the vaccine:  Has had an allergic reaction after a previous dose of any vaccine that protects against tetanus, diphtheria, or pertussis, or has any severe, life-threatening allergies  Has had a coma, decreased level of consciousness, or prolonged seizures within 7 days after a previous dose of any pertussis vaccine (DTP, DTaP, or Tdap)  Has seizures or another nervous system problem  Has ever had Guillain-Barr Syndrome (also called "GBS")  Has had severe pain or swelling after a previous dose of any vaccine that protects against tetanus or diphtheria In some cases, your health care provider may decide to postpone Tdap vaccination until a future visit. People with minor illnesses, such as a cold, may be vaccinated. People who are moderately or severely ill should usually wait until they recover before getting Tdap vaccine.  Your health care provider can give you more information. 4. Risks of a vaccine reaction  Pain, redness, or swelling where the shot was given, mild fever, headache, feeling tired, and nausea, vomiting, diarrhea, or stomachache sometimes happen after Tdap vaccination. People sometimes faint after medical procedures, including vaccination. Tell your provider if you feel dizzy or have vision changes or ringing in the ears.  As with any medicine,  there is a very remote chance of a vaccine causing a severe allergic reaction, other serious injury, or death. 5. What if there is a serious problem? An allergic reaction could occur after the vaccinated person leaves the clinic. If you see signs of a severe  allergic reaction (hives, swelling of the face and throat, difficulty breathing, a fast heartbeat, dizziness, or weakness), call 9-1-1 and get the person to the nearest hospital. For other signs that concern you, call your health care provider.  Adverse reactions should be reported to the Vaccine Adverse Event Reporting System (VAERS). Your health care provider will usually file this report, or you can do it yourself. Visit the VAERS website at www.vaers.LAgents.no or call 548-396-9192. VAERS is only for reporting reactions, and VAERS staff members do not give medical advice. 6. The National Vaccine Injury Compensation Program The [employer] Vaccine Injury Compensation Program (VICP) is a federal program that was created to compensate people who may have been injured by certain vaccines. Claims regarding alleged injury or death due to vaccination have a time limit for filing, which may be as short as two years. Visit the VICP website at SpiritualWord.at or call (231)598-7019 to learn about the program and about filing a claim. 7. How can I learn more?  Ask your health care provider.  Call your local or state health department.  Visit the website of the Food and Drug Administration (FDA) for vaccine package inserts and additional information at FinderList.no.  Contact the Centers for Disease Control and Prevention (CDC): ? Call 6126705600 (1-800-CDC-INFO) or ? Visit CDC's website at PicCapture.uy. Vaccine Information Statement Tdap (Tetanus, Diphtheria, Pertussis) Vaccine (11/24/2019) This information is not intended to replace advice given to you by your health care provider. Make sure you discuss any questions you have with your health care provider. Document Revised: 12/20/2019 Document Reviewed: 12/20/2019 Elsevier Patient Education  2021 [employer].

## 2020-05-15 NOTE — Assessment & Plan Note (Signed)
Improved with A1C of 6.7 today!! Also with weight loss of 20+ pounds, commended her on this!  Continue Victoza 1.8 mg daily. Continue Metformin XR 1000 mg daily.  Foot exam today. Managed on statin and ACE. Eye exam UTD.  Follow up in 3 months.

## 2020-05-15 NOTE — Assessment & Plan Note (Signed)
Chronic, has been taking Tylenol or Advil PM. Trial of Trazodone 50 mg sent to pharmacy. She will update.

## 2020-05-15 NOTE — Assessment & Plan Note (Signed)
Mammogram overdue, ordered today. She will schedule.

## 2020-05-15 NOTE — Progress Notes (Signed)
Subjective:    Patient ID: Jaime Gilmore, female    DOB: [birth year], 45 y.o.   MRN: [id number]  HPI  This visit occurred during the SARS-CoV-2 public health emergency.  Safety protocols were in place, including screening questions prior to the visit, additional usage of staff PPE, and extensive cleaning of exam room while observing appropriate contact time as indicated for disinfecting solutions.   Jaime Gilmore is a 45 year old female with a history of type 2 diabetes, PCOS, hyperlipidemia, GAD, chronic back pain who presents today for follow up. She is due for Tdap.  1) Type 2 Diabetes:  Current medications include: Victoza 1.8 mg daily, Metformin XR 1000 mg  She is checking her blood glucose several times weekly but has not checked in about one month.    She is working on her diet, limiting carbs, decreasing portion sizes.   Last A1C: 8.2 in November 2021 Last Eye Exam: Completed in 2021 Last Foot Exam: Due Pneumonia Vaccination: ACE/ARB: Lisinopril  Statin: Lipitor   Wt Readings from Last 3 Encounters:  05/15/20 273 lb (123.8 kg)  02/05/20 296 lb (134.3 kg)  06/27/19 296 lb 8 oz (134.5 kg)    2) GAD: Currently managed on Lexapro 20 mg, Buspar 5 mg BID. Overall doing well on this regimen, but has noticed increased anxiety over the last 6 months. She has had a lot of stress due to increased demands. She does have long term insomnia (for years), has difficulty falling and staying asleep. She's currently taking Tylenol PM or Advil PM with improvement sometimes. She cannot shut her brain down. She's not tried anything prescription for sleep.   BP Readings from Last 3 Encounters:  05/15/20 124/76  01/28/20 117/73  06/27/19 134/80     Review of Systems  Eyes: Negative for visual disturbance.  Respiratory: Negative for shortness of breath.   Cardiovascular: Negative for chest pain.  Neurological: Negative for dizziness and headaches.  Psychiatric/Behavioral: Positive for  sleep disturbance. The patient is nervous/anxious.        Past Medical History:  Diagnosis Date  . Anxiety   . BRCA gene mutation negative in female 06/2015  . Cervical high risk HPV (human papillomavirus) test positive 05/15/09;05/22/10;12/16/10;2014   hpv pos  . Dysmenorrhea   . Family history of breast cancer 06/2015  . History of mammogram 02/19/2015   BIRAD I  . History of Papanicolaou smear of cervix 06/25/2015   RNIL;NEG  . Hyperlipidemia   . Increased risk of breast cancer 08/2015   IBIS=24%  . Insomnia   . Onychomycosis 06/12/2014  . Pap smear abnormality of cervix with ASCUS favoring dysplasia 05/22/10;12/16/10;11/27/11;05/30/12  . Pap smear abnormality of cervix with LGSIL 05/15/2009  . Polycystic ovaries   . Type 2 diabetes mellitus ([place])   . Vitamin D deficiency      Social History   Socioeconomic History  . Marital status: Married    Spouse name: Not on file  . Number of children: 0  . Years of education: 58  . Highest education level: Not on file  Occupational History  . Occupation: business  Tobacco Use  . Smoking status: Current Every Day Smoker    Packs/day: 1.00  . Smokeless tobacco: Never Used  Vaping Use  . Vaping Use: Never used  Substance and Sexual Activity  . Alcohol use: No  . Drug use: No  . Sexual activity: Yes    Birth control/protection: None  Other Topics Concern  . Not on  file  Social History Narrative   Married.   No children.    Works in [place].   Enjoys watching movies, swimming.    Social Determinants of Health   Financial Resource Strain: Not on file  Food Insecurity: Not on file  Transportation Needs: Not on file  Physical Activity: Not on file  Stress: Not on file  Social Connections: Not on file  Intimate Partner Violence: Not on file    Past Surgical History:  Procedure Laterality Date  . CERVICAL BIOPSY  W/ LOOP ELECTRODE EXCISION  06/2012   PATH NEG  . COLPOSCOPY  05/15/09;2/17;12/3; 5/13   NO LESIONS  .  INTRAUTERINE DEVICE (IUD) INSERTION  08/2007  . IUD REMOVAL  05/30/2012  . LAPAROSCOPIC CHOLECYSTECTOMY  2004  . SKIN LESION EXCISION  2007   DERMATOFIBROMA    Family History  Problem Relation Age of Onset  . Breast cancer Maternal Aunt 40       no contact  . Breast cancer Mother 33  . Anemia Mother   . High Cholesterol Mother   . Hypertension Father   . Cancer Maternal Uncle        pancreatic  . Diabetes Maternal Grandmother   . Hypertension Paternal Grandmother   . Hearing loss Paternal Grandmother   . Heart disease Paternal Grandmother   . High Cholesterol Paternal Grandmother   . Cancer Cousin        ovarian- cured, no contact  . Hearing loss Paternal Grandfather   . Early death Paternal Grandfather     No Known Allergies  Current Outpatient Medications on File Prior to Visit  Medication Sig Dispense Refill  . ALPRAZolam (XANAX) 0.5 MG tablet Take 1 tablet (0.5 mg total) by mouth daily as needed for anxiety. Use sparingly. 30 tablet 0  . atorvastatin (LIPITOR) 10 MG tablet TAKE 1 TABLET BY MOUTH EVERY DAY 90 tablet 1  . busPIRone (BUSPAR) 5 MG tablet TAKE 1 TABLET (5 MG TOTAL) BY MOUTH 2 (TWO) TIMES DAILY. FOR ANXIETY. 180 tablet 1  . escitalopram (LEXAPRO) 20 MG tablet TAKE 1 TABLET (20 MG TOTAL) BY MOUTH DAILY. FOR ANXIETY. 90 tablet 1  . famotidine (PEPCID) 20 MG tablet TAKE 1 TABLET (20 MG TOTAL) BY MOUTH 2 (TWO) TIMES DAILY. FOR HEARTBURN. 180 tablet 1  . fluticasone (FLONASE) 50 MCG/ACT nasal spray PLACE 1 SPRAY INTO BOTH NOSTRILS 2 (TWO) TIMES DAILY. 16 g 2  . glucose blood (ONETOUCH ULTRA) test strip 1 each by Other route 2 (two) times daily as needed for other. 200 each 1  . Insulin Pen Needle (CARETOUCH PEN NEEDLES) 31G X 5 MM MISC 1 each by Does not apply route daily. 90 each 1  . Lancets 30G MISC 1 each by Does not apply route 2 (two) times daily as needed. 200 each 1  . lisinopril (ZESTRIL) 5 MG tablet TAKE 1 TABLET BY MOUTH EVERY DAY 90 tablet 1  .  metFORMIN (GLUCOPHAGE-XR) 500 MG 24 hr tablet Take 2 tablets (1,000 mg total) by mouth daily with breakfast. For diabetes. 180 tablet 3   No current facility-administered medications on file prior to visit.    BP 124/76   Pulse (!) 102   Temp 98.6 F (37 C) (Temporal)   Ht 5' 1"  (1.549 m)   Wt 273 lb (123.8 kg)   SpO2 97%   BMI 51.58 kg/m    Objective:   Physical Exam Constitutional:      Appearance: She is well-nourished.  Cardiovascular:  Rate and Rhythm: Normal rate and regular rhythm.  Pulmonary:     Effort: Pulmonary effort is normal.     Breath sounds: Normal breath sounds.  Musculoskeletal:     Cervical back: Neck supple.  Skin:    General: Skin is warm and dry.  Psychiatric:        Mood and Affect: Mood and affect and mood normal.            Assessment & Plan:

## 2020-05-15 NOTE — Assessment & Plan Note (Signed)
Chronic, increased over the last 6 months. Has done well on Lexapro and Buspar overall.  Continue Lexapro 20 mg. Increase Buspar to 7.5 mg BID or 10 mg BID if needed.  She will update. Will need to send new Rx.

## 2020-05-16 LAB — HEPATITIS C ANTIBODY
Hepatitis C Ab: NONREACTIVE
SIGNAL TO CUT-OFF: 0.01 (ref <1.00)

## 2020-05-16 LAB — HIV ANTIBODY (ROUTINE TESTING W REFLEX): HIV 1&2 Ab, 4th Generation: NONREACTIVE

## 2020-05-22 ENCOUNTER — Ambulatory Visit: Payer: BC Managed Care – PPO | Admitting: Obstetrics and Gynecology

## 2020-06-10 ENCOUNTER — Other Ambulatory Visit: Payer: Self-pay | Admitting: Primary Care

## 2020-06-10 DIAGNOSIS — G4700 Insomnia, unspecified: Secondary | ICD-10-CM

## 2020-06-30 ENCOUNTER — Other Ambulatory Visit: Payer: Self-pay | Admitting: Primary Care

## 2020-06-30 DIAGNOSIS — E119 Type 2 diabetes mellitus without complications: Secondary | ICD-10-CM

## 2020-07-01 DIAGNOSIS — R87619 Unspecified abnormal cytological findings in specimens from cervix uteri: Secondary | ICD-10-CM | POA: Insufficient documentation

## 2020-07-01 NOTE — Progress Notes (Signed)
Chief Complaint  Patient presents with  . Gynecologic Exam    BTB in between cycles since January, no abnormal pain     HPI:      [name] is a 45 y.o. G0P0000 who LMP was Patient's last menstrual period was 06/18/2020 (approximate)., presents today for her annual examination. Her menses are regular every 28-30 days, lasting 7-8 days, light to mod flow. Did skip a couple months this past fall.  Dysmenorrhea mild, occurring first 1-2 days of flow. Takes NSAIDs with relief. Has been having a few days of extra bleeding a day or so after her period stops for the past few months. Had 1 day of heavy flow this past month. Under increased stress recently. She has a hx of PCOS. Also having vasomotor sx.   She is not sex active currently due to husband's health issue-- Conception ok.  Last Pap: 11/16/18  Results were: AGUS /neg HPV DNA; Colpo/bx was neg on 12/02/18; repeat pap 06/16/19 was neg cells. Repeat pap due today. STD: hx of LGSIL/HPV DNA with 3 prior LEEPs  Last mammogram: 03/09/18 Results were: normal--routine follow-up in 12 months There is a FH of breast cancer in her mom and mat aunt. There is no FH of ovarian cancer. Pt had neg MyRisk testing 2017. The patient does do self-breast exams. IBIS=24%. She has not had screening breast MRI and declined last yr. She is no longer taking Vit D supp.  Tobacco use: Quit smoking, now vaping daily, wants to wean down Alcohol use: none Drug use: none Exercise: not active  She does get adequate calcium but not Vitamin D in her diet.  Colonoscopy: never  PCP managing DM, anxiety, hyperlipidemia.   Past Medical History:  Diagnosis Date  . Anxiety   . BRCA gene mutation negative in female 06/2015  . Cervical high risk HPV (human papillomavirus) test positive 05/15/09;05/22/10;12/16/10;2014   hpv pos  . Dysmenorrhea   . Family history of breast cancer 06/2015  . History of mammogram 02/19/2015   BIRAD I  . History of  Papanicolaou smear of cervix 06/25/2015   RNIL;NEG  . Hyperlipidemia   . Increased risk of breast cancer 08/2015   IBIS=24%  . Insomnia   . Onychomycosis 06/12/2014  . Pap smear abnormality of cervix with ASCUS favoring dysplasia 05/22/10;12/16/10;11/27/11;05/30/12  . Pap smear abnormality of cervix with LGSIL 05/15/2009  . Polycystic ovaries   . Type 2 diabetes mellitus ([place])   . Vitamin D deficiency     Past Surgical History:  Procedure Laterality Date  . CERVICAL BIOPSY  W/ LOOP ELECTRODE EXCISION  06/2012   PATH NEG  . COLPOSCOPY  05/15/09;2/17;12/3; 5/13   NO LESIONS  . INTRAUTERINE DEVICE (IUD) INSERTION  08/2007  . IUD REMOVAL  05/30/2012  . LAPAROSCOPIC CHOLECYSTECTOMY  2004  . SKIN LESION EXCISION  2007   DERMATOFIBROMA    Family History  Problem Relation Age of Onset  . Breast cancer Maternal Aunt 40       no contact  . Breast cancer Mother 57  . Anemia Mother   . High Cholesterol Mother   . Hypertension Father   . Cancer Maternal Uncle        pancreatic  . Diabetes Maternal Grandmother   . Hypertension Paternal Grandmother   . Hearing loss Paternal Grandmother   . Heart disease Paternal Grandmother   . High Cholesterol Paternal Grandmother   . Cancer Cousin  ovarian- cured, no contact  . Hearing loss Paternal Grandfather   . Early death Paternal Grandfather     Social History   Socioeconomic History  . Marital status: Married    Spouse name: Not on file  . Number of children: 0  . Years of education: 30  . Highest education level: Not on file  Occupational History  . Occupation: business  Tobacco Use  . Smoking status: Current Every Day Smoker    Packs/day: 1.00  . Smokeless tobacco: Never Used  Vaping Use  . Vaping Use: Never used  Substance and Sexual Activity  . Alcohol use: No  . Drug use: No  . Sexual activity: Not Currently    Birth control/protection: None  Other Topics Concern  . Not on file  Social History Narrative   Married.    No children.    Works in [place].   Enjoys watching movies, swimming.    Social Determinants of Health   Financial Resource Strain: Not on file  Food Insecurity: Not on file  Transportation Needs: Not on file  Physical Activity: Not on file  Stress: Not on file  Social Connections: Not on file  Intimate Partner Violence: Not on file    Current Outpatient Medications on File Prior to Visit  Medication Sig Dispense Refill  . ALPRAZolam (XANAX) 0.5 MG tablet Take 1 tablet (0.5 mg total) by mouth daily as needed for anxiety. Use sparingly. 30 tablet 0  . atorvastatin (LIPITOR) 10 MG tablet TAKE 1 TABLET BY MOUTH EVERY DAY 90 tablet 1  . busPIRone (BUSPAR) 5 MG tablet Take 1.5 tablets (7.5 mg total) by mouth 2 (two) times daily. For anxiety. 180 tablet 0  . escitalopram (LEXAPRO) 20 MG tablet TAKE 1 TABLET (20 MG TOTAL) BY MOUTH DAILY. FOR ANXIETY. 90 tablet 1  . famotidine (PEPCID) 20 MG tablet TAKE 1 TABLET (20 MG TOTAL) BY MOUTH 2 (TWO) TIMES DAILY. FOR HEARTBURN. 180 tablet 1  . fluticasone (FLONASE) 50 MCG/ACT nasal spray PLACE 1 SPRAY INTO BOTH NOSTRILS 2 (TWO) TIMES DAILY. 16 g 2  . glucose blood (ONETOUCH ULTRA) test strip 1 each by Other route 2 (two) times daily as needed for other. 200 each 1  . Insulin Pen Needle (CARETOUCH PEN NEEDLES) 31G X 5 MM MISC 1 each by Does not apply route daily. 90 each 1  . Lancets 30G MISC 1 each by Does not apply route 2 (two) times daily as needed. 200 each 1  . liraglutide (VICTOZA) 18 MG/3ML SOPN Inject 1.8 mg into the skin daily. For diabetes. 27 mL 1  . lisinopril (ZESTRIL) 5 MG tablet TAKE 1 TABLET BY MOUTH EVERY DAY 90 tablet 1  . metFORMIN (GLUCOPHAGE-XR) 500 MG 24 hr tablet TAKE 2 TABLETS (1,000 MG TOTAL) BY MOUTH DAILY WITH BREAKFAST FOR DIABETES. 180 tablet 3  . traZODone (DESYREL) 50 MG tablet Take 1 tablet (50 mg total) by mouth at bedtime as needed for sleep. 30 tablet 0   No current facility-administered medications on file prior to  visit.      ROS:  Review of Systems  Constitutional: Negative for fatigue, fever and unexpected weight change.  Respiratory: Negative for cough, shortness of breath and wheezing.   Cardiovascular: Negative for chest pain, palpitations and leg swelling.  Gastrointestinal: Negative for blood in stool, constipation, diarrhea, nausea and vomiting.  Endocrine: Negative for cold intolerance, heat intolerance and polyuria.  Genitourinary: Positive for menstrual problem. Negative for dyspareunia, dysuria, flank pain, frequency, genital  sores, hematuria, pelvic pain, urgency, vaginal bleeding, vaginal discharge and vaginal pain.  Musculoskeletal: Negative for back pain, joint swelling and myalgias.  Skin: Negative for rash.  Allergic/Immunologic: Negative for food allergies.  Neurological: Negative for dizziness, syncope, light-headedness, numbness and headaches.  Hematological: Negative for adenopathy.  Psychiatric/Behavioral: Positive for agitation. Negative for confusion, sleep disturbance and suicidal ideas. The patient is not nervous/anxious.      Objective: BP 120/80   Ht 5' 1"  (1.549 m)   Wt 274 lb (124.3 kg)   LMP 06/18/2020 (Approximate)   BMI 51.77 kg/m    Physical Exam Constitutional:      Appearance: She is well-developed.  Genitourinary:     Vulva normal.     Right Labia: No rash, tenderness or lesions.    Left Labia: No tenderness, lesions or rash.    No vaginal discharge, erythema, tenderness or bleeding.      Right Adnexa: not tender and no mass present.    Left Adnexa: not tender and no mass present.    No cervical friability or polyp.     Uterus is not enlarged or tender.  Breasts:     Right: No mass, nipple discharge, skin change or tenderness.     Left: No mass, nipple discharge, skin change or tenderness.    Neck:     Thyroid: No thyromegaly.  Cardiovascular:     Rate and Rhythm: Normal rate and regular rhythm.     Heart sounds: Normal heart sounds.  No murmur heard.   Pulmonary:     Effort: Pulmonary effort is normal.     Breath sounds: Normal breath sounds.  Abdominal:     Palpations: Abdomen is soft.     Tenderness: There is no abdominal tenderness. There is no guarding or rebound.  Musculoskeletal:        General: Normal range of motion.     Cervical back: Normal range of motion.  Lymphadenopathy:     Cervical: No cervical adenopathy.  Neurological:     General: No focal deficit present.     Mental Status: She is alert and oriented to person, place, and time.     Cranial Nerves: No cranial nerve deficit.  Skin:    General: Skin is warm and dry.  Psychiatric:        Mood and Affect: Mood normal.        Behavior: Behavior normal.        Thought Content: Thought content normal.        Judgment: Judgment normal.  Vitals reviewed.     Assessment/Plan: Encounter for annual routine gynecological examination  Cervical cancer screening - Plan: Cytology - PAP  Screening for HPV (human papillomavirus) - Plan: Cytology - PAP  Atypical glandular cells of undetermined significance (AGUS) on cervical Pap smear - Plan: Cytology - PAP; repeat pap today. Will f/u with results.   Encounter for screening mammogram for malignant neoplasm of breast - Plan: MM 3D SCREEN BREAST BILATERAL; pt to sched mammo  Family history of breast cancer - Plan: MM 3D SCREEN BREAST BILATERAL  Increased risk of breast cancer - Plan: MM 3D SCREEN BREAST BILATERAL; MyRisk neg. Recommend monthly SBE, yearly CBE and mammos, as well as scr breast MRI. Sched mammo, f/u for MRI ref if desires. Resume Vit D supp.  Abnormal uterine bleeding (AUB) - Plan: US PELVIS TRANSVAGINAL NON-OB (TV ONLY); for several months, hx of PCOS. Check GYN u/s, will f/u with results. If pap abnormal, pt ready for  hyst.   Screening for colon cancer - Plan: Cologuard; colonoscopy/cologuard discussed. Pt elects cologuard. Ref sent. Will f/u with results.         GYN counsel breast  self exam, mammography screening, adequate intake of calcium and vitamin D, diet and exercise     F/U  Return in about 1 year (around 07/02/2021).  Azrael Maddix B. Jonathan Corpus, PA-C 07/02/2020 9:31 AM

## 2020-07-02 ENCOUNTER — Other Ambulatory Visit (HOSPITAL_COMMUNITY)
Admission: RE | Admit: 2020-07-02 | Discharge: 2020-07-02 | Disposition: A | Discharge status: Home / Self Care | Payer: Self-pay | Source: Ambulatory Visit | Attending: Obstetrics and Gynecology | Admitting: Obstetrics and Gynecology

## 2020-07-02 ENCOUNTER — Encounter: Payer: Self-pay | Admitting: Obstetrics and Gynecology

## 2020-07-02 ENCOUNTER — Other Ambulatory Visit: Payer: Self-pay

## 2020-07-02 ENCOUNTER — Ambulatory Visit (INDEPENDENT_AMBULATORY_CARE_PROVIDER_SITE_OTHER): Payer: BC Managed Care – PPO | Admitting: Obstetrics and Gynecology

## 2020-07-02 VITALS — BP 120/80 | Ht 61.0 in | Wt 274.0 lb

## 2020-07-02 DIAGNOSIS — R87619 Unspecified abnormal cytological findings in specimens from cervix uteri: Secondary | ICD-10-CM | POA: Diagnosis not present

## 2020-07-02 DIAGNOSIS — Z9189 Other specified personal risk factors, not elsewhere classified: Secondary | ICD-10-CM

## 2020-07-02 DIAGNOSIS — Z124 Encounter for screening for malignant neoplasm of cervix: Secondary | ICD-10-CM

## 2020-07-02 DIAGNOSIS — Z1151 Encounter for screening for human papillomavirus (HPV): Secondary | ICD-10-CM | POA: Diagnosis not present

## 2020-07-02 DIAGNOSIS — Z1231 Encounter for screening mammogram for malignant neoplasm of breast: Secondary | ICD-10-CM

## 2020-07-02 DIAGNOSIS — Z01419 Encounter for gynecological examination (general) (routine) without abnormal findings: Secondary | ICD-10-CM | POA: Diagnosis not present

## 2020-07-02 DIAGNOSIS — Z803 Family history of malignant neoplasm of breast: Secondary | ICD-10-CM

## 2020-07-02 DIAGNOSIS — N939 Abnormal uterine and vaginal bleeding, unspecified: Secondary | ICD-10-CM

## 2020-07-02 DIAGNOSIS — Z1211 Encounter for screening for malignant neoplasm of colon: Secondary | ICD-10-CM

## 2020-07-02 NOTE — Patient Instructions (Signed)
I value your feedback and you entrusting us with your care. If you get a [facility] patient survey, I would appreciate you taking the time to let us know about your experience today. Thank you!  [facility]: [phone]  [facility]: [phone]    

## 2020-07-03 LAB — CYTOLOGY - PAP
Comment: NEGATIVE
Diagnosis: NEGATIVE
High risk HPV: NEGATIVE

## 2020-07-06 ENCOUNTER — Other Ambulatory Visit: Payer: Self-pay | Admitting: Primary Care

## 2020-07-06 DIAGNOSIS — G4700 Insomnia, unspecified: Secondary | ICD-10-CM

## 2020-07-08 LAB — HM DIABETES EYE EXAM

## 2020-07-10 ENCOUNTER — Encounter: Payer: Self-pay | Admitting: Primary Care

## 2020-07-10 ENCOUNTER — Other Ambulatory Visit: Payer: Self-pay

## 2020-07-10 DIAGNOSIS — F411 Generalized anxiety disorder: Secondary | ICD-10-CM

## 2020-07-10 MED ORDER — BUSPIRONE HCL 5 MG PO TABS: 7.5000 mg | ORAL_TABLET | Freq: Two times a day (BID) | ORAL | 0 refills | Status: DC

## 2020-07-24 ENCOUNTER — Other Ambulatory Visit: Payer: Self-pay | Admitting: Primary Care

## 2020-07-24 DIAGNOSIS — K219 Gastro-esophageal reflux disease without esophagitis: Secondary | ICD-10-CM

## 2020-08-14 ENCOUNTER — Telehealth (INDEPENDENT_AMBULATORY_CARE_PROVIDER_SITE_OTHER): Payer: BC Managed Care – PPO | Admitting: Primary Care

## 2020-08-14 ENCOUNTER — Encounter: Payer: Self-pay | Admitting: Primary Care

## 2020-08-14 ENCOUNTER — Other Ambulatory Visit: Payer: Self-pay

## 2020-08-14 VITALS — Ht 61.0 in | Wt 274.0 lb

## 2020-08-14 DIAGNOSIS — J309 Allergic rhinitis, unspecified: Secondary | ICD-10-CM | POA: Diagnosis not present

## 2020-08-14 DIAGNOSIS — H5789 Other specified disorders of eye and adnexa: Secondary | ICD-10-CM | POA: Diagnosis not present

## 2020-08-14 MED ORDER — FLUTICASONE PROPIONATE 50 MCG/ACT NA SUSP: 1.0000 | Freq: Two times a day (BID) | NASAL | 2 refills | Status: DC

## 2020-08-14 MED ORDER — OLOPATADINE HCL 0.1 % OP SOLN: 1.0000 [drp] | Freq: Two times a day (BID) | OPHTHALMIC | 0 refills | Status: DC

## 2020-08-14 MED ORDER — ERYTHROMYCIN 5 MG/GM OP OINT: 1.0000 "application " | TOPICAL_OINTMENT | Freq: Every day | OPHTHALMIC | 0 refills | Status: DC

## 2020-08-14 NOTE — Progress Notes (Signed)
Patient ID: Jaime Gilmore, female    DOB: Mar 14, 1976, 45 y.o.   MRN: [id number]  Virtual visit completed through [employer], a video enabled telemedicine application. Due to national recommendations of social distancing due to COVID-19, a virtual visit is felt to be most appropriate for this patient at this time. Reviewed limitations, risks, security and privacy concerns of performing a virtual visit and the availability of in person appointments. I also reviewed that there may be a patient responsible charge related to this service. The patient agreed to proceed.   Patient location: home Provider location:  at [facility], office Persons participating in this virtual visit: patient, provider   If any vitals were documented, they were collected by patient at home unless specified below.    Ht 5\' 1"  (1.549 m)   Wt 274 lb (124.3 kg)   BMI 51.77 kg/m    CC: Eye swelling Subjective:   HPI: Jaime Gilmore is a 45 y.o. female with a history of type 2 diabetes, hyperlipidemia, PCOS, GAD presenting on 08/14/2020 for Eye Pain (Left eye swelling and itching x 1 day )  Her pain is located to the left upper eye lid for which began two days ago. Since then her entire upper eye lid has been swollen and tender. She's also noticed watery eyes and itching.   She's also noticed sinus pressure, headaches, sneezing. She just [place] from the beach a [place] days ago. She's not taken a Covid test. She's tried taking OTC "Cold and Cough" without improvement. She denies right eye symptoms.        Relevant past medical, surgical, family and social history reviewed and updated as indicated. Interim medical history since our last visit reviewed. Allergies and medications reviewed and updated. Outpatient Medications Prior to Visit  Medication Sig Dispense Refill  . ALPRAZolam (XANAX) 0.5 MG tablet Take 1 tablet (0.5 mg total) by mouth daily as needed for anxiety. Use sparingly. 30 tablet 0  .  atorvastatin (LIPITOR) 10 MG tablet TAKE 1 TABLET BY MOUTH EVERY DAY 90 tablet 1  . busPIRone (BUSPAR) 5 MG tablet Take 1.5 tablets (7.5 mg total) by mouth 2 (two) times daily. For anxiety. 90 tablet 0  . escitalopram (LEXAPRO) 20 MG tablet TAKE 1 TABLET (20 MG TOTAL) BY MOUTH DAILY. FOR ANXIETY. 90 tablet 1  . famotidine (PEPCID) 20 MG tablet TAKE 1 TABLET (20 MG TOTAL) BY MOUTH 2 (TWO) TIMES DAILY. FOR HEARTBURN. 180 tablet 0  . glucose blood (ONETOUCH ULTRA) test strip 1 each by Other route 2 (two) times daily as needed for other. 200 each 1  . Insulin Pen Needle (CARETOUCH PEN NEEDLES) 31G X 5 MM MISC 1 each by Does not apply route daily. 90 each 1  . Lancets 30G MISC 1 each by Does not apply route 2 (two) times daily as needed. 200 each 1  . liraglutide (VICTOZA) 18 MG/3ML SOPN Inject 1.8 mg into the skin daily. For diabetes. 27 mL 1  . lisinopril (ZESTRIL) 5 MG tablet TAKE 1 TABLET BY MOUTH EVERY DAY 90 tablet 1  . metFORMIN (GLUCOPHAGE-XR) 500 MG 24 hr tablet TAKE 2 TABLETS (1,000 MG TOTAL) BY MOUTH DAILY WITH BREAKFAST FOR DIABETES. 180 tablet 3  . traZODone (DESYREL) 50 MG tablet TAKE 1 TABLET BY MOUTH AT BEDTIME AS NEEDED FOR SLEEP. 90 tablet 0  . fluticasone (FLONASE) 50 MCG/ACT nasal spray PLACE 1 SPRAY INTO BOTH NOSTRILS 2 (TWO) TIMES DAILY. 16 g 2   No  facility-administered medications prior to visit.     Per HPI unless specifically indicated in ROS section below Review of Systems Objective:  Ht 5\' 1"  (1.549 m)   Wt 274 lb (124.3 kg)   BMI 51.77 kg/m   Wt Readings from Last 3 Encounters:  08/14/20 274 lb (124.3 kg)  07/02/20 274 lb (124.3 kg)  05/15/20 273 lb (123.8 kg)       Physical exam: Gen: alert, NAD, not ill appearing Pulm: speaks in complete sentences without increased work of breathing Psych: normal mood, normal thought content   Obvious swelling to left upper eye lid with mild erythema. No injection to sclera.      Results for orders placed or performed  in visit on 07/10/20  HM DIABETES EYE EXAM  Result Value Ref Range   HM Diabetic Eye Exam No Retinopathy No Retinopathy   Assessment & Plan:   Problem List Items Addressed This Visit      Respiratory   Allergic rhinitis   Relevant Medications   fluticasone (FLONASE) 50 MCG/ACT nasal spray   olopatadine (PATADAY) 0.1 % ophthalmic solution    Other Visit Diagnoses    Eye swelling, left    -  Primary   Relevant Medications   olopatadine (PATADAY) 0.1 % ophthalmic solution   erythromycin ophthalmic ointment       Meds ordered this encounter  Medications  . fluticasone (FLONASE) 50 MCG/ACT nasal spray    Sig: Place 1 spray into both nostrils 2 (two) times daily.    Dispense:  16 g    Refill:  2    Order Specific Question:   Supervising Provider    Answer:   BEDSOLE, AMY E [[id number]]  . olopatadine (PATADAY) 0.1 % ophthalmic solution    Sig: Place 1 drop into the left eye 2 (two) times daily. For allergies    Dispense:  5 mL    Refill:  0    Order Specific Question:   Supervising Provider    Answer:   BEDSOLE, AMY E [[id number]]  . erythromycin ophthalmic ointment    Sig: Place 1 application into the left eye at bedtime.    Dispense:  3.5 g    Refill:  0    Order Specific Question:   Supervising Provider    Answer:   BEDSOLE, AMY E [[id number]]   No orders of the defined types were placed in this encounter.   I discussed the assessment and treatment plan with the patient. The patient was provided an opportunity to ask questions and all were answered. The patient agreed with the plan and demonstrated an understanding of the instructions. The patient was advised to call back or seek an in-person evaluation if the symptoms worsen or if the condition fails to improve as anticipated.  Follow up plan:  Apply the antibiotic ointment at bedtime for about one week.  Use the allergy eye drops as needed for itching and watery eyes.  Please update me if no improvement in 3-4 days.  It was a  pleasure to see you today! 07/12/20, NP-C   Mayra Reel, NP

## 2020-08-14 NOTE — Patient Instructions (Signed)
Apply the antibiotic ointment at bedtime for about one week.  Use the allergy eye drops as needed for itching and watery eyes.  Please update me if no improvement in 3-4 days.  It was a pleasure to see you today! Mayra Reel, NP-C

## 2020-08-14 NOTE — Assessment & Plan Note (Signed)
Refill provided for [facility].

## 2020-08-14 NOTE — Assessment & Plan Note (Signed)
Appears allergy induced, cannot rule out bacterial involvement.  Rx for erythromycin ointment sent to pharmacy along with Pataday eye drops for allergies.   She will update if no improvement.

## 2020-08-14 NOTE — Telephone Encounter (Signed)
Called patient virtual appointment made for today

## 2020-08-20 ENCOUNTER — Encounter: Payer: Self-pay | Admitting: Obstetrics and Gynecology

## 2020-08-21 ENCOUNTER — Ambulatory Visit: Admission: RE | Admit: 2020-08-21 | Payer: BC Managed Care – PPO | Source: Ambulatory Visit

## 2020-08-30 ENCOUNTER — Other Ambulatory Visit: Payer: Self-pay

## 2020-08-30 DIAGNOSIS — K219 Gastro-esophageal reflux disease without esophagitis: Secondary | ICD-10-CM

## 2020-08-30 MED ORDER — FAMOTIDINE 20 MG PO TABS: 20.0000 mg | ORAL_TABLET | Freq: Two times a day (BID) | ORAL | 2 refills | Status: DC

## 2020-09-03 ENCOUNTER — Other Ambulatory Visit: Payer: Self-pay | Admitting: Primary Care

## 2020-09-03 DIAGNOSIS — E119 Type 2 diabetes mellitus without complications: Secondary | ICD-10-CM

## 2020-09-04 ENCOUNTER — Ambulatory Visit: Payer: BC Managed Care – PPO | Admitting: Primary Care

## 2020-09-04 ENCOUNTER — Other Ambulatory Visit: Payer: Self-pay

## 2020-09-04 ENCOUNTER — Encounter: Payer: Self-pay | Admitting: Primary Care

## 2020-09-04 VITALS — BP 134/78 | HR 96 | Temp 98.6°F | Ht 61.0 in | Wt 279.0 lb

## 2020-09-04 DIAGNOSIS — E119 Type 2 diabetes mellitus without complications: Secondary | ICD-10-CM | POA: Diagnosis not present

## 2020-09-04 DIAGNOSIS — F411 Generalized anxiety disorder: Secondary | ICD-10-CM

## 2020-09-04 DIAGNOSIS — M545 Low back pain, unspecified: Secondary | ICD-10-CM

## 2020-09-04 DIAGNOSIS — G8929 Other chronic pain: Secondary | ICD-10-CM

## 2020-09-04 DIAGNOSIS — Z6841 Body Mass Index (BMI) 40.0 and over, adult: Secondary | ICD-10-CM

## 2020-09-04 LAB — POCT GLYCOSYLATED HEMOGLOBIN (HGB A1C): Hemoglobin A1C: 6.4 % — AB (ref 4.0–5.6)

## 2020-09-04 MED ORDER — BUSPIRONE HCL 5 MG PO TABS: 5.0000 mg | ORAL_TABLET | Freq: Three times a day (TID) | ORAL | 0 refills | Status: DC

## 2020-09-04 NOTE — Assessment & Plan Note (Signed)
Weight has come up five pounds since last visit, overall still down 15 since prior to January 2022.  She is eating a poor diet, is not exercising. Strongly advised she work on both.  Continue Victoza 1.8 mg daily. Consider dose increase to 3 mg after she makes dietary changes and if no further weight loss.

## 2020-09-04 NOTE — Assessment & Plan Note (Signed)
Increased with mood swings. Could be perimenopausal given night sweats and food cravings.  Increase Buspar to 5 mg TID, new Rx sent to pharmacy. Continue Lexapro 20 mg.

## 2020-09-04 NOTE — Assessment & Plan Note (Signed)
Well controlled in the office today with A1C of 6.4. Long discussion regarding the need to improve her diet and exercise.   We decided to continue her Victoza at 1.8 mg daily and Metformin XR 1000 mg daily.  We will plan to see her back in 3 months.

## 2020-09-04 NOTE — Assessment & Plan Note (Signed)
Agreed to renew handicap placard for 6 months. Strongly encouraged weight loss, she has lost 20 pounds thus far.  She will notify us in mid June so we can complete the paperwork.

## 2020-09-04 NOTE — Progress Notes (Signed)
Subjective:    Patient ID: Jaime Gilmore, female    DOB: 1975-08-13, 45 y.o.   MRN: [id number]  HPI  Jaime Gilmore is a very pleasant 45 y.o. female with a history of type 2 diabetes, obesity, PCOS, hyperlipidemia who presents today for follow up of diabetes and weight loss.  She would also like her handicap tag renewed. She uses this sparingly for her chronic low back pain. She cannot walk long distances due to her pain. Her tag expires in July 2022.  She was last evaluated in January 2022 for follow up of diabetes and weight loss, she had lost 20+ pounds during this visit and A1C was 6.7.  Current medications include: Victoza 1.8 mg daily, Metformin XR 1000 mg daily  She is checking her blood glucose 0 times daily.   Last A1C: 6.7 in January 2022, 6.4 today Last Eye Exam: UTD Last Foot Exam:  UTD Pneumonia Vaccination: 2019 ACE/ARB: Lisinopril  Statin: Lipitor    Wt Readings from Last 3 Encounters:  09/04/20 279 lb (126.6 kg)  08/14/20 274 lb (124.3 kg)  07/02/20 274 lb (124.3 kg)   She has had a tough time with her weight over the last several months as she's reached a plateau. She believes she's going through menopause and has had an increased cravings in sweets and junk food, mood swings.   Diet currently consists of:  Breakfast: Skips sometimes, breakfast burritos Lunch: Skips sometimes, sliced cheese, salami slices, pretzels Dinner: Tacos, taco salads, grilled chicken, fish, pasta, rice Snacks: Chex mix, yogurt covered Chex, candy, pretzels  Desserts: Candy, daily  Beverages: Diet coke, some water  Exercise: She is not exercising    Review of Systems  Eyes: Negative for visual disturbance.  Respiratory: Negative for shortness of breath.   Cardiovascular: Negative for chest pain.  Neurological: Negative for dizziness and headaches.         Past Medical History:  Diagnosis Date  . Anxiety   . BRCA gene mutation negative in female 06/2015  .  Cervical high risk HPV (human papillomavirus) test positive 05/15/09;05/22/10;12/16/10;2014   hpv pos  . Dysmenorrhea   . Family history of breast cancer 06/2015  . History of mammogram 02/19/2015   BIRAD I  . History of Papanicolaou smear of cervix 06/25/2015   RNIL;NEG  . Hyperlipidemia   . Increased risk of breast cancer 08/2015   IBIS=24%  . Insomnia   . Onychomycosis 06/12/2014  . Pap smear abnormality of cervix with ASCUS favoring dysplasia 05/22/10;12/16/10;11/27/11;05/30/12  . Pap smear abnormality of cervix with LGSIL 05/15/2009  . Polycystic ovaries   . Type 2 diabetes mellitus ([place])   . Vitamin D deficiency     Social History   Socioeconomic History  . Marital status: Married    Spouse name: Not on file  . Number of children: 0  . Years of education: 59  . Highest education level: Not on file  Occupational History  . Occupation: business  Tobacco Use  . Smoking status: Current Every Day Smoker    Packs/day: 1.00  . Smokeless tobacco: Never Used  Vaping Use  . Vaping Use: Never used  Substance and Sexual Activity  . Alcohol use: No  . Drug use: No  . Sexual activity: Not Currently    Birth control/protection: None  Other Topics Concern  . Not on file  Social History Narrative   Married.   No children.    Works in [place].   Enjoys watching movies,  swimming.    Social Determinants of Health   Financial Resource Strain: Not on file  Food Insecurity: Not on file  Transportation Needs: Not on file  Physical Activity: Not on file  Stress: Not on file  Social Connections: Not on file  Intimate Partner Violence: Not on file    Past Surgical History:  Procedure Laterality Date  . CERVICAL BIOPSY  W/ LOOP ELECTRODE EXCISION  06/2012   PATH NEG  . COLPOSCOPY  05/15/09;2/17;12/3; 5/13   NO LESIONS  . INTRAUTERINE DEVICE (IUD) INSERTION  08/2007  . IUD REMOVAL  05/30/2012  . LAPAROSCOPIC CHOLECYSTECTOMY  2004  . SKIN LESION EXCISION  2007   DERMATOFIBROMA     Family History  Problem Relation Age of Onset  . Breast cancer Maternal Aunt 40       no contact  . Breast cancer Mother 6  . Anemia Mother   . High Cholesterol Mother   . Hypertension Father   . Cancer Maternal Uncle        pancreatic  . Diabetes Maternal Grandmother   . Hypertension Paternal Grandmother   . Hearing loss Paternal Grandmother   . Heart disease Paternal Grandmother   . High Cholesterol Paternal Grandmother   . Cancer Cousin        ovarian- cured, no contact  . Hearing loss Paternal Grandfather   . Early death Paternal Grandfather     No Known Allergies  Current Outpatient Medications on File Prior to Visit  Medication Sig Dispense Refill  . ALPRAZolam (XANAX) 0.5 MG tablet Take 1 tablet (0.5 mg total) by mouth daily as needed for anxiety. Use sparingly. 30 tablet 0  . atorvastatin (LIPITOR) 10 MG tablet TAKE 1 TABLET BY MOUTH EVERY DAY 90 tablet 1  . busPIRone (BUSPAR) 5 MG tablet Take 1.5 tablets (7.5 mg total) by mouth 2 (two) times daily. For anxiety. 90 tablet 0  . escitalopram (LEXAPRO) 20 MG tablet TAKE 1 TABLET (20 MG TOTAL) BY MOUTH DAILY. FOR ANXIETY. 90 tablet 1  . famotidine (PEPCID) 20 MG tablet Take 1 tablet (20 mg total) by mouth 2 (two) times daily. For heartburn. 180 tablet 2  . fluticasone (FLONASE) 50 MCG/ACT nasal spray Place 1 spray into both nostrils 2 (two) times daily. 16 g 2  . glucose blood (ONETOUCH ULTRA) test strip 1 each by Other route 2 (two) times daily as needed for other. 200 each 1  . Insulin Pen Needle (CARETOUCH PEN NEEDLES) 31G X 5 MM MISC 1 each by Does not apply route daily. 90 each 1  . Lancets 30G MISC 1 each by Does not apply route 2 (two) times daily as needed. 200 each 1  . liraglutide (VICTOZA) 18 MG/3ML SOPN Inject 1.8 mg into the skin daily. For diabetes. 27 mL 1  . lisinopril (ZESTRIL) 5 MG tablet TAKE 1 TABLET BY MOUTH EVERY DAY 90 tablet 1  . metFORMIN (GLUCOPHAGE-XR) 500 MG 24 hr tablet TAKE 2 TABLETS (1,000  MG TOTAL) BY MOUTH DAILY WITH BREAKFAST FOR DIABETES. 180 tablet 3  . traZODone (DESYREL) 50 MG tablet TAKE 1 TABLET BY MOUTH AT BEDTIME AS NEEDED FOR SLEEP. 90 tablet 0   No current facility-administered medications on file prior to visit.    BP 134/78   Pulse 96   Temp 98.6 F (37 C) (Temporal)   Ht 5' 1"  (1.549 m)   Wt 279 lb (126.6 kg)   SpO2 96%   BMI 52.72 kg/m  Objective:  Physical Exam Cardiovascular:     Rate and Rhythm: Normal rate and regular rhythm.  Pulmonary:     Effort: Pulmonary effort is normal.     Breath sounds: Normal breath sounds.  Musculoskeletal:     Cervical back: Neck supple.  Skin:    General: Skin is warm and dry.           Assessment & Plan:      This visit occurred during the SARS-CoV-2 public health emergency.  Safety protocols were in place, including screening questions prior to the visit, additional usage of staff PPE, and extensive cleaning of exam room while observing appropriate contact time as indicated for disinfecting solutions.

## 2020-09-04 NOTE — Patient Instructions (Signed)
Work on [employer] as discussed. Ensure you are consuming 64 ounces of water daily.  Start exercising. You should be getting 150 minutes of moderate intensity exercise weekly.  Please schedule a follow up appointment in 3 months for weight check and diabetes follow up.  It was a pleasure to see you today!

## 2020-09-11 ENCOUNTER — Ambulatory Visit: Payer: BC Managed Care – PPO | Admitting: Primary Care

## 2020-09-20 ENCOUNTER — Telehealth: Payer: Self-pay

## 2020-09-20 ENCOUNTER — Encounter: Payer: Self-pay | Admitting: Obstetrics and Gynecology

## 2020-09-20 NOTE — Telephone Encounter (Signed)
Called pt to f/u on Cologuard test, is she still planning to complete test? No answer, LVMTRC.

## 2020-09-24 NOTE — Telephone Encounter (Signed)
Per pt has the test at her house, she is not sure if her insurance will cover it or not. She is going to call her insurance company to see and let us know.

## 2020-10-04 ENCOUNTER — Other Ambulatory Visit: Payer: Self-pay | Admitting: Primary Care

## 2020-10-04 DIAGNOSIS — E119 Type 2 diabetes mellitus without complications: Secondary | ICD-10-CM

## 2020-10-04 DIAGNOSIS — E1169 Type 2 diabetes mellitus with other specified complication: Secondary | ICD-10-CM

## 2020-10-04 DIAGNOSIS — E785 Hyperlipidemia, unspecified: Secondary | ICD-10-CM

## 2020-10-05 ENCOUNTER — Other Ambulatory Visit: Payer: Self-pay | Admitting: Primary Care

## 2020-10-05 DIAGNOSIS — G4700 Insomnia, unspecified: Secondary | ICD-10-CM

## 2020-10-28 ENCOUNTER — Other Ambulatory Visit: Payer: Self-pay | Admitting: Primary Care

## 2020-10-28 DIAGNOSIS — F411 Generalized anxiety disorder: Secondary | ICD-10-CM

## 2020-11-06 ENCOUNTER — Other Ambulatory Visit: Payer: Self-pay | Admitting: Obstetrics and Gynecology

## 2020-11-06 DIAGNOSIS — F411 Generalized anxiety disorder: Secondary | ICD-10-CM

## 2020-11-06 MED ORDER — ALPRAZOLAM 0.5 MG PO TABS: 0.5000 mg | ORAL_TABLET | Freq: Every day | ORAL | 0 refills | Status: DC | PRN

## 2020-11-06 NOTE — Telephone Encounter (Signed)
Do you want me to have patient make appointment?

## 2020-11-30 ENCOUNTER — Other Ambulatory Visit: Payer: Self-pay | Admitting: Primary Care

## 2020-11-30 DIAGNOSIS — F411 Generalized anxiety disorder: Secondary | ICD-10-CM

## 2020-12-02 NOTE — Telephone Encounter (Signed)
Called patient she is still taking Buspar tid as well as the Xanax from GYN. Very sparingly. She does not need refill until after you return. I informed her that it would be addressed after you return the week of 8/24.   Patient also wanted to know is she can be changed from the Victoza to the [facility]. She has found coupon that she can get for $25 a month and would like to try it. Patient has been informed that you will address both once you are back in the office. States that she has medication to last until then.

## 2020-12-05 ENCOUNTER — Other Ambulatory Visit: Payer: Self-pay | Admitting: Primary Care

## 2020-12-05 DIAGNOSIS — E119 Type 2 diabetes mellitus without complications: Secondary | ICD-10-CM

## 2020-12-05 NOTE — Telephone Encounter (Signed)
Phone note sent to you for review patient requested change in medications. Per phone note aware that you are out of the office and will address once returning.

## 2020-12-07 NOTE — Telephone Encounter (Signed)
See other refill request response regarding this. Does she want me to refill the Victoza or does she want to come in sooner for follow up (see other note) so we can make the switch?

## 2020-12-07 NOTE — Telephone Encounter (Signed)
From my last note in May 2022 it looks like I asked her to make a follow up visit with me in 3 months (mid August 2022) for follow up of weight and diabetes.   Refill sent in for Buspar.   I am willing to make the change to [facility], glad she found a coupon, but she needs the follow up visit first, as discussed during her last visit. Needs A1C and weight check. Please schedule.

## 2020-12-09 NOTE — Telephone Encounter (Signed)
Spoke to patient she can not get in before she runs out of Victoza. Requesting one month refill to get her by until appointment we have made in September for follow up and will talk about changing over then.

## 2020-12-10 NOTE — Telephone Encounter (Signed)
Requested Prescriptions   Signed Prescriptions Disp Refills   liraglutide (VICTOZA) 18 MG/3ML SOPN 15 mL 0    Sig: INJECT 1.8 MG INTO THE SKIN DAILY. FOR DIABETES.    Authorizing Provider: Doreene Nest   Refill(s) sent to pharmacy.

## 2020-12-10 NOTE — Telephone Encounter (Signed)
Patient is scheduled for visit on 01/07/21 at 7:20 am.

## 2021-01-01 ENCOUNTER — Other Ambulatory Visit: Payer: Self-pay | Admitting: Primary Care

## 2021-01-01 DIAGNOSIS — E119 Type 2 diabetes mellitus without complications: Secondary | ICD-10-CM

## 2021-01-06 ENCOUNTER — Telehealth: Payer: Self-pay | Admitting: Primary Care

## 2021-01-06 NOTE — Telephone Encounter (Signed)
Called pt to remind her of appt and pt said she is not able to make appts in [place]. Pt said when she made appt through [facility] and it said Jaime Gilmore. I let pt know that we have relocated due to renovations and that Vernona Rieger is at the [place] location in [place]. Pt wants to know about her med refill because the appt was for that. Please advise

## 2021-01-07 ENCOUNTER — Ambulatory Visit: Payer: BC Managed Care – PPO | Admitting: Primary Care

## 2021-01-07 NOTE — Telephone Encounter (Signed)
Please notify patient that she can conduct a virtual visit with me and have her labs drawn at the [facility] office.  She will need her lab appointment completed first, then virtual visit with me so we can discuss results. Thanks.

## 2021-01-07 NOTE — Telephone Encounter (Signed)
Pt has lab appt 9/23 @ 4 and mychart video visit on 9/28 @ 9:40

## 2021-01-09 ENCOUNTER — Telehealth: Payer: Self-pay | Admitting: *Deleted

## 2021-01-09 DIAGNOSIS — E119 Type 2 diabetes mellitus without complications: Secondary | ICD-10-CM

## 2021-01-09 NOTE — Telephone Encounter (Signed)
Please place future orders for lab appt.  

## 2021-01-09 NOTE — Telephone Encounter (Signed)
Orders placed.

## 2021-01-10 ENCOUNTER — Other Ambulatory Visit: Payer: BC Managed Care – PPO

## 2021-01-15 ENCOUNTER — Telehealth: Payer: BC Managed Care – PPO | Admitting: Primary Care

## 2021-01-15 ENCOUNTER — Other Ambulatory Visit: Payer: Self-pay

## 2021-01-15 ENCOUNTER — Encounter: Payer: Self-pay | Admitting: Obstetrics and Gynecology

## 2021-01-15 DIAGNOSIS — N939 Abnormal uterine and vaginal bleeding, unspecified: Secondary | ICD-10-CM

## 2021-01-16 NOTE — Telephone Encounter (Signed)
Pls call pt with [facility] u/s appt info. I'll call pt with resutls. Thx.

## 2021-01-16 NOTE — Telephone Encounter (Signed)
Patient is scheduled for 01/31/21 at [facility]

## 2021-01-17 ENCOUNTER — Other Ambulatory Visit: Payer: Self-pay

## 2021-01-17 ENCOUNTER — Other Ambulatory Visit (INDEPENDENT_AMBULATORY_CARE_PROVIDER_SITE_OTHER): Payer: BC Managed Care – PPO

## 2021-01-17 DIAGNOSIS — E119 Type 2 diabetes mellitus without complications: Secondary | ICD-10-CM | POA: Diagnosis not present

## 2021-01-17 LAB — POCT GLYCOSYLATED HEMOGLOBIN (HGB A1C): Hemoglobin A1C: 7.1 % — AB (ref 4.0–5.6)

## 2021-01-23 ENCOUNTER — Other Ambulatory Visit: Payer: Self-pay

## 2021-01-23 ENCOUNTER — Encounter: Payer: Self-pay | Admitting: Primary Care

## 2021-01-23 ENCOUNTER — Telehealth (INDEPENDENT_AMBULATORY_CARE_PROVIDER_SITE_OTHER): Payer: BC Managed Care – PPO | Admitting: Primary Care

## 2021-01-23 ENCOUNTER — Other Ambulatory Visit: Payer: Self-pay | Admitting: Primary Care

## 2021-01-23 DIAGNOSIS — E1169 Type 2 diabetes mellitus with other specified complication: Secondary | ICD-10-CM

## 2021-01-23 DIAGNOSIS — F411 Generalized anxiety disorder: Secondary | ICD-10-CM

## 2021-01-23 DIAGNOSIS — Z6841 Body Mass Index (BMI) 40.0 and over, adult: Secondary | ICD-10-CM

## 2021-01-23 DIAGNOSIS — K219 Gastro-esophageal reflux disease without esophagitis: Secondary | ICD-10-CM

## 2021-01-23 DIAGNOSIS — J309 Allergic rhinitis, unspecified: Secondary | ICD-10-CM | POA: Diagnosis not present

## 2021-01-23 DIAGNOSIS — G8929 Other chronic pain: Secondary | ICD-10-CM

## 2021-01-23 DIAGNOSIS — M5441 Lumbago with sciatica, right side: Secondary | ICD-10-CM

## 2021-01-23 DIAGNOSIS — M5442 Lumbago with sciatica, left side: Secondary | ICD-10-CM

## 2021-01-23 MED ORDER — BUSPIRONE HCL 10 MG PO TABS: 10.0000 mg | ORAL_TABLET | Freq: Three times a day (TID) | ORAL | 0 refills | Status: DC

## 2021-01-23 MED ORDER — FLUTICASONE PROPIONATE 50 MCG/ACT NA SUSP: 1.0000 | Freq: Two times a day (BID) | NASAL | 1 refills | Status: DC

## 2021-01-23 MED ORDER — TIRZEPATIDE 5 MG/0.5ML ~~LOC~~ SOAJ: SUBCUTANEOUS | 0 refills | Status: DC

## 2021-01-23 NOTE — Telephone Encounter (Signed)
Based off of our visit today, patient was going to use a coupon card rather than her insurance for the [facility].  Can you double check to make sure this is still a plan?  Also, I placed her handicap placard in your inbox, patient is requesting that we mail this to her home.

## 2021-01-23 NOTE — Patient Instructions (Signed)
You will be contacted regarding your referral to neurosurgery.  Please let us know if you have not been contacted within two weeks.   Stop Victoza. Start Mounjaro. Inject 2.5 mg into the skin once weekly for 4 weeks, then increase to 5 mg once weekly thereafter.  We increased your Buspar to 10 mg twice to three times daily for anxiety. Avoid use of Xanax. Continue Lexapro 20 mg.   We will see you in 3 months!  It was a pleasure to see you today!

## 2021-01-23 NOTE — Assessment & Plan Note (Signed)
Referral placed to neurosurgery given ongoing symptoms.  Agree to renew handicap placard for 6 months.

## 2021-01-23 NOTE — Assessment & Plan Note (Signed)
Deteriorated and appears situational with work.  Discouraged use of Xanax.  Increase Buspar to 10 mg BID to TID. Continue Lexapro 20 mg daily.  Follow up in 3 months.

## 2021-01-23 NOTE — Assessment & Plan Note (Signed)
No significant weight gain, but also no loss since last visit.  Stop Victoza. Start Mounjaro 2.5 mg weekly x 4 weeks, then 5 mg weekly thereafter. Discussed potential side effects.  We will see her back in 3 months.

## 2021-01-23 NOTE — Progress Notes (Signed)
Patient ID: Jaime Gilmore, female    DOB: Nov 12, 1975, 45 y.o.   MRN: [id number]  Virtual visit completed through [employer], a video enabled telemedicine application. Due to national recommendations of social distancing due to COVID-19, a virtual visit is felt to be most appropriate for this patient at this time. Reviewed limitations, risks, security and privacy concerns of performing a virtual visit and the availability of in person appointments. I also reviewed that there may be a patient responsible charge related to this service. The patient agreed to proceed.   Patient location: home Provider location: [facility], office Persons participating in this virtual visit: patient, provider   If any vitals were documented, they were collected by patient at home unless specified below.    Ht 5\' 1"  (1.549 m)   Wt 279 lb (126.6 kg)   BMI 52.72 kg/m    CC: Follow up for Diabetes and Obesity Subjective:   HPI: Jaime Gilmore is a 45 y.o. female with a history of type 2 diabetes, morbid obesity, GAD, chronic lower back pain presenting on 01/23/2021 for follow up of diabetes, to discuss chronic back pain, to discuss anxiety, renew handicap placard, and medication refills.   She was last evaluated in May 2022 in person for diabetes and obesity follow up. During this visit she was managed on Victoza 1.8 mg daily, weight loss of 15 pounds since January 2022. A1C 6.4. she was advised to follow up three months later for weight check and A1C.  Today she is visiting with February 2022 virtually. A1C completed last week which was 7.1. she is compliant to Victoza 1.8 mg daily. She is monitoring her weight at home which is fluctuating 279 to mid 280's. She would like to try [facility] for weight loss and diabetes. She finds it cumbersome to inject Victoza daily.   She is trying to work on her diet, is trying to cut back on carbs, diet soda, and fast food. Admits she has been struggling. She and her  husband cook meals at home often, makes a lot of crockpot meals. A lot of soups, pot roast.   She is not exercising due to her chronic mid lower back pain and bilateral lower extremity numbness that radiates down to the thighs. She is needing a new handicap placard due to chronic lower back pain and bilateral leg numbness. She cannot make it through [employer] without resting several times. She tried to mop the kitchen floor a few days ago, had to stop due to her pain.   She's seen a chiropractor five years ago after falling down some stairs, was told that she had a bulging disc, could not afford chiropractic treatment at the time. Her pain is mostly present standing and ambulating. Has to sit on a stool while cooking.  She is open for evaluation now.   Increased anxiety at work over the last several months. Has been taking "part of a Xanax daily" during the work days mostly. She is compliant to her Lexapro 20 mg daily, which has helped overall previously. She's taking Buspar 5 mg in the AM and 10 mg at night with some improvement. Feels there is room for improvement.   Wt Readings from Last 3 Encounters:  01/23/21 279 lb (126.6 kg)  09/04/20 279 lb (126.6 kg)  08/14/20 274 lb (124.3 kg)         Relevant past medical, surgical, family and social history reviewed and updated as indicated. Interim medical history since  our last visit reviewed. Allergies and medications reviewed and updated. Outpatient Medications Prior to Visit  Medication Sig Dispense Refill   ALPRAZolam (XANAX) 0.5 MG tablet Take 1 tablet (0.5 mg total) by mouth daily as needed for anxiety. Use sparingly. (Patient taking differently: Take 0.5 mg by mouth daily as needed for anxiety. Use sparingly. Taking 1/4 tab daily) 30 tablet 0   atorvastatin (LIPITOR) 10 MG tablet TAKE 1 TABLET BY MOUTH EVERY DAY for cholesterol. 90 tablet 1   B-D UF III MINI PEN NEEDLES 31G X 5 MM MISC USE AS DIRECTED EVERY DAY 100 each 2   [employer] Lancets  Ultra-Thin 30G MISC USE 2 (TWO) TIMES DAILY AS NEEDED. 200 each 2   escitalopram (LEXAPRO) 20 MG tablet TAKE 1 TABLET (20 MG TOTAL) BY MOUTH DAILY. FOR ANXIETY. 90 tablet 2   famotidine (PEPCID) 20 MG tablet Take 1 tablet (20 mg total) by mouth 2 (two) times daily. For heartburn. 180 tablet 2   glucose blood (ONETOUCH ULTRA) test strip 1 each by Other route 2 (two) times daily as needed for other. 200 each 1   lisinopril (ZESTRIL) 5 MG tablet TAKE 1 TABLET BY MOUTH EVERY DAY for blood pressure. 90 tablet 3   metFORMIN (GLUCOPHAGE-XR) 500 MG 24 hr tablet TAKE 2 TABLETS (1,000 MG TOTAL) BY MOUTH DAILY WITH BREAKFAST FOR DIABETES. (Patient taking differently: Take 1,000 mg by mouth daily with breakfast. For diabetes. One tablet in morning and one tablet at night) 180 tablet 3   traZODone (DESYREL) 50 MG tablet TAKE 1 TABLET BY MOUTH EVERY DAY AT BEDTIME AS NEEDED FOR SLEEP 90 tablet 1   busPIRone (BUSPAR) 5 MG tablet TAKE 1 TABLET (5 MG TOTAL) BY MOUTH 3 (THREE) TIMES DAILY. FOR ANXIETY. 270 tablet 1   fluticasone (FLONASE) 50 MCG/ACT nasal spray Place 1 spray into both nostrils 2 (two) times daily. 16 g 2   liraglutide (VICTOZA) 18 MG/3ML SOPN INJECT 1.8 MG INTO THE SKIN DAILY. FOR DIABETES. 15 mL 0   No facility-administered medications prior to visit.     Per HPI unless specifically indicated in ROS section below Review of Systems  Musculoskeletal:  Positive for back pain.  Neurological:  Positive for weakness and numbness.  Psychiatric/Behavioral:  The patient is nervous/anxious.   Objective:  Ht 5\' 1"  (1.549 m)   Wt 279 lb (126.6 kg)   BMI 52.72 kg/m   Wt Readings from Last 3 Encounters:  01/23/21 279 lb (126.6 kg)  09/04/20 279 lb (126.6 kg)  08/14/20 274 lb (124.3 kg)       Physical exam: Gen: alert, NAD, not ill appearing Pulm: speaks in complete sentences without increased work of breathing Psych: normal mood, normal thought content      Results for orders placed or  performed in visit on 01/17/21  POCT glycosylated hemoglobin (Hb A1C)  Result Value Ref Range   Hemoglobin A1C 7.1 (A) 4.0 - 5.6 %   HbA1c POC (<> result, manual entry)     HbA1c, POC (prediabetic range)     HbA1c, POC (controlled diabetic range)     Assessment & Plan:   Problem List Items Addressed This Visit       Respiratory   Allergic rhinitis   Relevant Medications   fluticasone (FLONASE) 50 MCG/ACT nasal spray     Digestive   Gastroesophageal reflux disease     Endocrine   Type 2 diabetes mellitus (HCC)    Increased with A1C of 7.1 last week.  Strongly advised she work on her diet.  Will stop Victoza and switch to [facility]. Start with 2.5 mg once weekly x 4 weeks, then 5 mg thereafter. Discussed potential side effects.  We will see her back in the office in 3 months for follow up. Continue Metformin XR 1000 mg daily.      Relevant Medications   tirzepatide [facility]) 5 MG/0.5ML Pen     Other   GAD (generalized anxiety disorder)    Deteriorated and appears situational with work.  Discouraged use of Xanax.  Increase Buspar to 10 mg BID to TID. Continue Lexapro 20 mg daily.  Follow up in 3 months.      Relevant Medications   busPIRone (BUSPAR) 10 MG tablet   Chronic back pain    Referral placed to neurosurgery given ongoing symptoms.  Agree to renew handicap placard for 6 months.      Relevant Orders   Ambulatory referral to Neurosurgery   Morbid obesity with BMI of 50.0-59.9, adult (HCC)    No significant weight gain, but also no loss since last visit.  Stop Victoza. Start Mounjaro 2.5 mg weekly x 4 weeks, then 5 mg weekly thereafter. Discussed potential side effects.  We will see her back in 3 months.      Relevant Medications   tirzepatide (MOUNJARO) 5 MG/0.5ML Pen     Meds ordered this encounter  Medications   fluticasone (FLONASE) 50 MCG/ACT nasal spray    Sig: Place 1 spray into both nostrils 2 (two) times daily.    Dispense:  48 g     Refill:  1   busPIRone (BUSPAR) 10 MG tablet    Sig: Take 1 tablet (10 mg total) by mouth 3 (three) times daily. For anxiety.    Dispense:  270 tablet    Refill:  0    Order Specific Question:   Supervising Provider    Answer:   BEDSOLE, AMY E [[id number]]   tirzepatide (MOUNJARO) 5 MG/0.5ML Pen    Sig: Inject 2.5 mg into the skin once weekly for 4 weeks, then inject 5 mg into the skin once weekly thereafter.    Dispense:  6 mL    Refill:  0    Order Specific Question:   Supervising Provider    Answer:   Ermalene Searing, AMY E [[id number]]   Orders Placed This Encounter  Procedures   Ambulatory referral to Neurosurgery    Referral Priority:   Routine    Referral Type:   Surgical    Referral Reason:   Specialty Services Required    Requested Specialty:   Neurosurgery    Number of Visits Requested:   1    I discussed the assessment and treatment plan with the patient. The patient was provided an opportunity to ask questions and all were answered. The patient agreed with the plan and demonstrated an understanding of the instructions. The patient was advised to call back or seek an in-person evaluation if the symptoms worsen or if the condition fails to improve as anticipated.  Follow up plan:  You will be contacted regarding your referral to neurosurgery.  Please let us know if you have not been contacted within two weeks.   Stop Victoza. Start Mounjaro. Inject 2.5 mg into the skin once weekly for 4 weeks, then increase to 5 mg once weekly thereafter.  We increased your Buspar to 10 mg twice to three times daily for anxiety. Avoid use of Xanax. Continue Lexapro 20 mg.   We  will see you in 3 months!  It was a pleasure to see you today!    Doreene Nest, NP

## 2021-01-23 NOTE — Assessment & Plan Note (Signed)
Increased with A1C of 7.1 last week.  Strongly advised she work on her diet.  Will stop Victoza and switch to [facility]. Start with 2.5 mg once weekly x 4 weeks, then 5 mg thereafter. Discussed potential side effects.  We will see her back in the office in 3 months for follow up. Continue Metformin XR 1000 mg daily.

## 2021-01-24 ENCOUNTER — Telehealth: Payer: Self-pay

## 2021-01-24 NOTE — Telephone Encounter (Signed)
Left message to return call to our office.  

## 2021-01-24 NOTE — Telephone Encounter (Signed)
As requested by patient parking placard has been filled out and mailed to home address. Copy sent to scan. No further action needed at this time.

## 2021-01-27 NOTE — Telephone Encounter (Signed)
Left message to return call to our office.  

## 2021-01-31 ENCOUNTER — Other Ambulatory Visit: Payer: Self-pay | Admitting: Primary Care

## 2021-01-31 ENCOUNTER — Ambulatory Visit: Payer: BC Managed Care – PPO

## 2021-01-31 DIAGNOSIS — N939 Abnormal uterine and vaginal bleeding, unspecified: Secondary | ICD-10-CM

## 2021-01-31 MED ORDER — TIRZEPATIDE 2.5 MG/0.5ML ~~LOC~~ SOAJ: 2.5000 mg | SUBCUTANEOUS | 0 refills | Status: DC

## 2021-02-01 ENCOUNTER — Other Ambulatory Visit: Payer: Self-pay | Admitting: Obstetrics and Gynecology

## 2021-02-01 DIAGNOSIS — Z803 Family history of malignant neoplasm of breast: Secondary | ICD-10-CM

## 2021-02-01 DIAGNOSIS — Z1231 Encounter for screening mammogram for malignant neoplasm of breast: Secondary | ICD-10-CM

## 2021-02-01 DIAGNOSIS — Z9189 Other specified personal risk factors, not elsewhere classified: Secondary | ICD-10-CM

## 2021-02-03 NOTE — Telephone Encounter (Signed)
Have called patient x 3 no answer no call back

## 2021-02-06 ENCOUNTER — Ambulatory Visit (INDEPENDENT_AMBULATORY_CARE_PROVIDER_SITE_OTHER): Payer: BC Managed Care – PPO

## 2021-02-06 ENCOUNTER — Other Ambulatory Visit: Payer: Self-pay

## 2021-02-06 DIAGNOSIS — N939 Abnormal uterine and vaginal bleeding, unspecified: Secondary | ICD-10-CM

## 2021-02-07 ENCOUNTER — Other Ambulatory Visit: Payer: Self-pay | Admitting: Primary Care

## 2021-02-07 DIAGNOSIS — E119 Type 2 diabetes mellitus without complications: Secondary | ICD-10-CM

## 2021-02-10 ENCOUNTER — Telehealth: Payer: Self-pay | Admitting: Obstetrics and Gynecology

## 2021-02-10 NOTE — Telephone Encounter (Signed)
Pt aware if neg GYN u/s results for AUB. Would like endometrial ablation vs hyst. RTO with MD for consult.

## 2021-02-18 ENCOUNTER — Ambulatory Visit (INDEPENDENT_AMBULATORY_CARE_PROVIDER_SITE_OTHER): Payer: BC Managed Care – PPO | Admitting: Obstetrics and Gynecology

## 2021-02-18 ENCOUNTER — Encounter: Payer: Self-pay | Admitting: Obstetrics and Gynecology

## 2021-02-18 ENCOUNTER — Other Ambulatory Visit: Payer: Self-pay

## 2021-02-18 VITALS — BP 116/72 | Ht 61.0 in | Wt 293.4 lb

## 2021-02-18 DIAGNOSIS — Z9189 Other specified personal risk factors, not elsewhere classified: Secondary | ICD-10-CM

## 2021-02-18 NOTE — Patient Instructions (Signed)
Hysterectomy Information A hysterectomy is a surgery in which the uterus is removed. The lowest part of the uterus (cervix), which opens into the vagina, may be removed as well. In some cases, the fallopian tubes, the ovaries,  or both the fallopian tubes and the ovaries may also be removed. This procedure may be done to treat different medical problems. It may also be done to help transgender men feel more masculine. After the procedure, a woman will no longer have menstrual periods and will not be able to become pregnant (sterile). What are the reasons for a hysterectomy? There are many reasons why a person might have this procedure. They include: Persistent, abnormal vaginal bleeding. Long-term (chronic) pelvic pain or infection. Endometriosis. This is when the lining of the uterus (endometrium) starts to grow outside the uterus. Adenomyosis. This is when the endometrium starts to grow in the muscle of the uterus. Pelvic organ prolapse. This is a condition in which the uterus falls down into the vagina. Noncancerous growths in the uterus (uterine fibroids) that cause symptoms. The presence of precancerous cells. Cervical or uterine cancer. Sex change. This helps a transgender man complete his female identity. What are the different types of hysterectomy? There are three different types of hysterectomy: Supracervical hysterectomy. In this type, the top part of the uterus is removed, but not the cervix. Total hysterectomy. In this type, the uterus and cervix are removed. Radical hysterectomy. In this type, the uterus, the cervix, and the tissue that holds the uterus in place (parametrium) are removed. What are the different ways a hysterectomy can be performed? There are many different ways a hysterectomy can be performed, including: Abdominal hysterectomy. In this type, an incision is made in the abdomen. The uterus is removed through this incision. Vaginal hysterectomy. In this type, an  incision is made in the vagina. The uterus is removed through this incision. There are no abdominal incisions. Conventional laparoscopic hysterectomy. In this type, 3 or 4 small incisions are made in the abdomen. A thin, lighted tube with a camera (laparoscope) is inserted into one of the incisions. Other tools are put through the other incisions. The uterus is cut into small pieces. The small pieces are removed through the incisions or the vagina. Laparoscopically assisted vaginal hysterectomy (LAVH). In this type, 3 or 4 small incisions are made in the abdomen. Part of the surgery is performed laparoscopically and the other part is done vaginally. The uterus is removed through the vagina. Robot-assisted laparoscopic hysterectomy. In this type, a laparoscope and other tools are inserted into 3 or 4 small incisions in the abdomen. A computer-controlled device is used to give the surgeon a 3D image and to help control the surgical instruments. This allows for more precise movements of surgical instruments. The uterus is cut into small pieces and removed through the incisions or the vagina. Discuss the options with your health care provider to determine which type is the right one for you. What are the risks of this surgery? Generally, this is a safe procedure. However, problems may occur, including: Bleeding and risk of blood transfusion. Tell your health care provider if you do not want to receive any blood products. Blood clots in the legs or lung. Infection. Damage to nearby structures or organs. Allergic reactions to medicines. Having to change to an abdominal hysterectomy after starting a less invasive technique. What to expect after a hysterectomy You will be given pain medicine. You may need to stay in the hospital for 1-2 days  to recover, depending on the type of hysterectomy you had. You will need to have someone with you for the first 3-5 days after you go home. You will need to follow up  with your surgeon in 2-4 weeks after surgery to evaluate your progress. If the ovaries are removed, you will have early menopause symptoms such as hot flashes, night sweats, and insomnia. If you had a hysterectomy for a problem that was not cancer or a condition that could not lead to cancer, then you no longer need Pap tests. However, even if you no longer need a Pap test, get regular pelvic exams to make sure no other problems are developing. Questions to ask your health care provider Is a hysterectomy medically necessary? Do I have other treatment options for my condition? What are my options for hysterectomy procedure? What organs and tissues need to be removed? What are the risks? What are the benefits? How long will I need to stay in the hospital after the procedure? How long will I need to recover at home? What symptoms can I expect after the procedure? Summary A hysterectomy is a surgery in which the uterus is removed. The fallopian tubes, the ovaries, or both may be removed as well. This procedure may be done to treat different medical problems. It may also be done to complete your female identity during a sex change. After the procedure, a woman will no longer have a menstrual period and will not be able to become pregnant. There are three types of hysterectomy. Discuss with your health care provider which options are right for you. This is a safe procedure, though there are some risks. Risks include infection, bleeding, blood clots, or damage to nearby organs. This information is not intended to replace advice given to you by your health care provider. Make sure you discuss any questions you have with your health care provider. Document Revised: 02/03/2019 Document Reviewed: 02/03/2019 Elsevier Patient Education  2021 [employer] Robot-Assisted Laparoscopic Surgery Information Robot-assisted laparoscopic surgery is a type of procedure that allows a surgeon to use robotic arms to  control tools and a camera. Unlike traditional (open) surgery in which a large incision is made, this type of surgery uses a few small incisions (minimally invasive surgery). Laparoscopic surgery means that the surgery is performed in the abdomen using a thin, flexible tube that has a light and a camera on the end (laparoscope). The image from the camera is shown on a monitor. This type of surgery is commonly used for: Prostate surgery. Surgery on the uterus or ovaries. Kidney surgery. Colon surgery. Hernia repair surgery. Bariatric surgery. Tell a health care provider about: Any allergies you have. All medicines you are taking, including vitamins, herbs, eye drops, creams, and over-the-counter medicines. Any problems you or family members have had with anesthetic medicines. Any blood disorders you have. Any surgeries you have had. Any medical conditions you have. Whether you are pregnant or may be pregnant. What are the risks and benefits? This type of surgery has many of the same risks as regular, open surgery. Generally, this is a safe procedure. However, problems may occur, including: Infection. Bleeding. Damage to other structures or organs. Allergic reactions to medicines, such as anesthetics. Pain, bruising, and swelling. Other side effects may depend on the type of surgery that is done. For example, prostate surgery may cause problems with urination. This type of surgery also has benefits, mostly because the incisions for this type of surgery are smaller. This may  result in: A shorter hospital stay. Faster healing time. Less blood loss. Less scarring. Less pain. In some cases, this type of surgery may also take less time to do than regular, open surgery. What happens before the surgery? Before the surgery, go over the procedures that will take place with your health care provider. Ask your health care provider: What is the goal of the surgery? Is this surgery the best  treatment option? What other options do I have? Does the surgeon have experience with this type of surgery? How much experience does the surgeon have? How long will surgery take? How long will it take to recover from surgery? What are the costs, and how do they compare to other surgical choices? How should I prepare for surgery? What happens during the surgery? There are differences between surgeries done with and surgeries done without robot assistance. These include: During open surgery, a large incision is made. This allows the surgeon to access the organ or body part that he or she is operating on. During these procedures, the surgeon holds the surgical tools in her or his hands. During a laparoscopic procedure, a few small incisions are made. A camera is inserted so the surgeon can see the body part. The surgeon holds the tools in her or his hands to reach the body part through the smaller incisions. During a robot-assisted laparoscopic procedure, a few small incisions are made. The surgeon sits at a console in the operating room and uses the controls at the console to move the robotic arms. The surgical tools and laparoscope are attached to the ends of the robotic arms. Ask your health care professional about any concerns you may have. What can I expect after the surgery? After your procedure, it is common to have: Mild discomfort in the abdomen. Soreness in the incision areas. Follow these instructions at home: Rest as told by your health care provider. Avoid sitting for a long time without moving. Get up to take short walks every 1-2 hours. This is important to improve blood flow and breathing. Ask for help if you feel weak or unsteady. Drink enough fluid to keep your urine pale yellow. Do not use any products that contain nicotine or tobacco. These products include cigarettes, chewing tobacco, and vaping devices, such as e-cigarettes. These can delay healing after surgery. If you need  help quitting, ask your health care provider. Keep all follow-up visits. This is important. Summary Robot-assisted laparoscopic surgery is a procedure that allows the surgeon to use robotic arms to control surgical tools and a camera. Unlike traditional (open) surgery, which involves a large incision, this type of surgery uses a few small incisions. This type of surgery has many of the same risks as regular, open surgery. This surgery may result in a shorter hospital stay, a faster recovery time, and less bleeding, pain, and scarring compared to open surgery. This information is not intended to replace advice given to you by your health care provider. Make sure you discuss any questions you have with your health care provider. Document Revised: 12/01/2019 Document Reviewed: 12/01/2019 Elsevier Patient Education  2022 [employer].

## 2021-02-18 NOTE — Progress Notes (Signed)
Patient ID: Jaime Gilmore, female   DOB: Sep 11, 1975, 45 y.o.   MRN: [id number]  Reason for Consult: Gynecologic Exam   Referred by Pleas Koch, NP  Subjective:     HPI:  Jaime Gilmore is a 45 y.o. female. She reports that over the last 6-12 months she had had a change in her menstrual bleeding that has resulted in heavy and extended periods of bleeding. She is bleeding for sometimes 30 days. She has passed clots larger than a quarter but smaller than a fist. The clots will sometimes fall out of her which is unpleasant. She is also experiencing more frequent hot flashes.  She has been considering management and id feeling strongly like she would desire a hysterectomy. She had a recent US. Pap smear is up to date.   Gynecological History  Patient's last menstrual period was 01/20/2021. Menarche: 9 Menopause: no  She reports passage of large clots She reports sensations of gushing or flooding of blood. She denies accidents where she bleeds through her clothing. She denies that she changes a saturated pad or tampon more frequently than every hour.  She reports that pain from her periods limits her activities.  History of fibroids, polyps, or ovarian cysts? : no  History of PCOS? yes Hstory of Endometriosis? no History of abnormal pap smears? Yes, reports history of multiple  LEEPs Have you had any sexually transmitted infections in the past? HPV  Last Pap: Results were: 2022 NIL   She identifies as a female. She is not sexually active with husband currently.   She denies dyspareunia. She denies postcoital bleeding.  She currently uses abstinence for contraception.   Obstetrical History No prior pregnancies  Past Medical History:  Diagnosis Date   Anxiety    BRCA gene mutation negative in female 06/2015   Cervical high risk HPV (human papillomavirus) test positive 05/15/09;05/22/10;12/16/10;2014   hpv pos   Dysmenorrhea    Family history of breast cancer 06/2015    History of mammogram 02/19/2015   BIRAD I   History of Papanicolaou smear of cervix 06/25/2015   RNIL;NEG   Hyperlipidemia    Increased risk of breast cancer 08/2015   IBIS=24%   Insomnia    Onychomycosis 06/12/2014   Pap smear abnormality of cervix with ASCUS favoring dysplasia 05/22/10;12/16/10;11/27/11;05/30/12   Pap smear abnormality of cervix with LGSIL 05/15/2009   Polycystic ovaries    Type 2 diabetes mellitus (HCC)    Vitamin D deficiency    Family History  Problem Relation Age of Onset   Breast cancer Maternal Aunt 40       no contact   Breast cancer Mother 35   Anemia Mother    High Cholesterol Mother    Hypertension Father    Cancer Maternal Uncle        pancreatic   Diabetes Maternal Grandmother    Hypertension Paternal Grandmother    Hearing loss Paternal Grandmother    Heart disease Paternal Grandmother    High Cholesterol Paternal Grandmother    Cancer Cousin        ovarian- cured, no contact   Hearing loss Paternal Grandfather    Early death Paternal Grandfather    Past Surgical History:  Procedure Laterality Date   CERVICAL BIOPSY  W/ LOOP ELECTRODE EXCISION  06/2012   PATH NEG   COLPOSCOPY  05/15/09;2/17;12/3; 5/13   NO LESIONS   INTRAUTERINE DEVICE (IUD) INSERTION  08/2007   IUD REMOVAL  05/30/2012   LAPAROSCOPIC  CHOLECYSTECTOMY  2004   SKIN LESION EXCISION  2007   DERMATOFIBROMA    Short Social History:  Social History   Tobacco Use   Smoking status: Every Day    Packs/day: 1.00    Types: Cigarettes   Smokeless tobacco: Never  Substance Use Topics   Alcohol use: No    No Known Allergies  Current Outpatient Medications  Medication Sig Dispense Refill   ALPRAZolam (XANAX) 0.5 MG tablet Take 1 tablet (0.5 mg total) by mouth daily as needed for anxiety. Use sparingly. (Patient taking differently: Take 0.5 mg by mouth daily as needed for anxiety. Use sparingly. Taking 1/4 tab daily) 30 tablet 0   atorvastatin (LIPITOR) 10 MG tablet TAKE 1  TABLET BY MOUTH EVERY DAY for cholesterol. 90 tablet 1   busPIRone (BUSPAR) 10 MG tablet Take 1 tablet (10 mg total) by mouth 3 (three) times daily. For anxiety. 270 tablet 0   escitalopram (LEXAPRO) 20 MG tablet TAKE 1 TABLET (20 MG TOTAL) BY MOUTH DAILY. FOR ANXIETY. 90 tablet 2   famotidine (PEPCID) 20 MG tablet Take 1 tablet (20 mg total) by mouth 2 (two) times daily. For heartburn. 180 tablet 2   fluticasone (FLONASE) 50 MCG/ACT nasal spray Place 1 spray into both nostrils 2 (two) times daily. 48 g 1   glucose blood (ONETOUCH ULTRA) test strip 1 each by Other route 2 (two) times daily as needed for other. 200 each 1   lisinopril (ZESTRIL) 5 MG tablet TAKE 1 TABLET BY MOUTH EVERY DAY for blood pressure. 90 tablet 3   metFORMIN (GLUCOPHAGE-XR) 500 MG 24 hr tablet TAKE 2 TABLETS (1,000 MG TOTAL) BY MOUTH DAILY WITH BREAKFAST FOR DIABETES. (Patient taking differently: Take 1,000 mg by mouth daily with breakfast. For diabetes. One tablet in morning and one tablet at night) 180 tablet 3   tirzepatide (MOUNJARO) 2.5 MG/0.5ML Pen Inject 2.5 mg into the skin once a week. For four weeks. 2 mL 0   traZODone (DESYREL) 50 MG tablet TAKE 1 TABLET BY MOUTH EVERY DAY AT BEDTIME AS NEEDED FOR SLEEP 90 tablet 1   B-D UF III MINI PEN NEEDLES 31G X 5 MM MISC USE AS DIRECTED EVERY DAY (Patient not taking: Reported on 02/18/2021) 100 each 2   [employer] Lancets Ultra-Thin 30G MISC USE 2 (TWO) TIMES DAILY AS NEEDED. (Patient not taking: Reported on 02/18/2021) 200 each 2   tirzepatide (MOUNJARO) 5 MG/0.5ML Pen Inject 2.5 mg into the skin once weekly for 4 weeks, then inject 5 mg into the skin once weekly thereafter. (Patient not taking: Reported on 02/18/2021) 6 mL 0   No current facility-administered medications for this visit.    Review of Systems  Constitutional: Negative for chills, fatigue, fever and unexpected weight change.  HENT: Negative for trouble swallowing.  Eyes: Negative for loss of vision.  Respiratory:  Negative for cough, shortness of breath and wheezing.  Cardiovascular: Negative for chest pain, leg swelling, palpitations and syncope.  GI: Negative for abdominal pain, blood in stool, diarrhea, nausea and vomiting.  GU: Negative for difficulty urinating, dysuria, frequency and hematuria.  Musculoskeletal: Negative for back pain, leg pain and joint pain.  Skin: Negative for rash.  Neurological: Negative for dizziness, headaches, light-headedness, numbness and seizures.  Psychiatric: Negative for behavioral problem, confusion, depressed mood and sleep disturbance.       Objective:  Objective   Vitals:   02/18/21 1500  BP: 116/72  Weight: 293 lb 6.4 oz (133.1 kg)  Height: 5'  1" (1.549 m)   Body mass index is 55.44 kg/m.  Physical Exam Vitals and nursing note reviewed. Exam conducted with a chaperone present.  Constitutional:      Appearance: Normal appearance. She is well-developed.  HENT:     Head: Normocephalic and atraumatic.  Eyes:     Extraocular Movements: Extraocular movements intact.     Pupils: Pupils are equal, round, and reactive to light.  Cardiovascular:     Rate and Rhythm: Normal rate and regular rhythm.  Pulmonary:     Effort: Pulmonary effort is normal. No respiratory distress.     Breath sounds: Normal breath sounds.  Abdominal:     General: Abdomen is flat.     Palpations: Abdomen is soft.  Musculoskeletal:        General: No signs of injury.  Skin:    General: Skin is warm and dry.  Neurological:     Mental Status: She is alert and oriented to person, place, and time.  Psychiatric:        Behavior: Behavior normal.        Thought Content: Thought content normal.        Judgment: Judgment normal.    Do you snore loudly? ( louder than talking or loud enough to be heard through closed doors?) YES Do you often feel tired, fatigued, or sleepy during daytime? YES Has anyone observed you stop breathing during sleep? NO Do you have or are you being  treated for high blood pressure? NO BMI> 35 YES Age> 50 NO Neck circumference > 40 cm 45 cm Female gender? NO ** if yes to > 3 questions high risk of obstructive sleep apnea ** if yes to <3 questions+ low risk for obstructive sleep apnea   Assessment/Plan:    45 yo with abnormal uterine bleeding Discussed options for medical versus surgical management of abnormal uterine / perimenoapusal bleeding.  Discussed primarily [place] IUD versus hysterectomy  Recommended sampling to exclude hyperplasia and malignancy given risk factors such as irregular cycles, obesity, and atypical glandular pap smear in past.   She will consider options for hysteroscopy D&C versus EMB and IUD versus hysterectomy  Advised regarding risks of slow surgical healing with elevated hgba1c  Recommended sleep apnea screening given risk factors.   More than 30 minutes were spent face to face with the patient in the room, reviewing the medical record, labs and images, and coordinating care for the patient. The plan of management was discussed in detail and counseling was provided.    Adrian Prows MD [facility], [facility] 02/18/2021 4:12 PM

## 2021-03-04 NOTE — Telephone Encounter (Signed)
Surgery Booking Request Patient Full Name:  Jaime Gilmore  MRN: [id number]  DOB: [birth year]  Surgeon: Natale Milch, MD  Requested Surgery Date and Time: at patient prefernence Primary Diagnosis AND Code: menorrhagia Secondary Diagnosis and Code:  Surgical Procedure: Hysteroscopy D&C RNFA Requested?: No L&D Notification: No Admission Status: same day surgery Length of Surgery: 25 min Special Case Needs: No H&P: Yes Phone Interview???:  ye Interpreter: No Medical Clearance:  Yes Special Scheduling Instructions: No Any known health/anesthesia issues, diabetes, sleep apnea, latex allergy, defibrillator/pacemaker?: Yes- possible sleep apnea (going for sleep study)  Acuity: P3   (P1 highest, P2 delay may cause harm, P3 low, elective gyn, P4 lowest) Post op follow up visits: 1 week post op

## 2021-03-06 ENCOUNTER — Telehealth: Payer: Self-pay

## 2021-03-06 NOTE — Telephone Encounter (Addendum)
Called patient to schedule Hysterodcopy D&C w Jerene Pitch  DOS 03/25/21  H&P 03/24/21 @ 8:10   Pre-admit phone call appointment to be requested - date and time will be included on H&P paper work. Also all appointments will be updated on pt MyChart. Explained that this appointment has a call window. Based on the time scheduled will indicate if the call will be received within a 4 hour window before 1:00 or after.  Advised that pt may also receive calls from the hospital pharmacy and pre-service center.  Confirmed pt has [employer] as Editor, commissioning. No secondary insurance.  Patient wanted to schedule this ASAP so that she could then schedule a hysterectomy. She was asking how soon before she can schedule that. I advised that I did not know and that I would ask CRS. She insinuated that she would like to have it done during her time off of work, Dec 23 - Jan 3.   FYI - Patient has hx of elevated A1C. 5mo f/u scheduled with PCP in Jan 2023 regarding this.

## 2021-03-07 ENCOUNTER — Telehealth: Payer: Self-pay

## 2021-03-07 NOTE — Telephone Encounter (Signed)
Patient called and wanted to cancel hysteroscopy d&c she had scheduled with [facility]. She decided that she may have made a hasty decision. She also mentioned that her fater was in a very bad car accident last week and she feels like there is just too much going on.  She requested to schedule an appt with Helmut Muster Copland for an IUD. She mentioned that she had the Mirena in the past but is unsure what type she wants this time. She said that she wants one with the least hormones as she feels perimenopausal and doesn't want the extra hormones. I questioned if she wanted a copper IUD (no hormones) and she was just unsure.  I scheduled her with [facility] on 03/18/21 for an IUD. I also canceled her hysteroscopy and H&P with CRS.

## 2021-03-16 NOTE — Telephone Encounter (Signed)
Ok. Per Dr. Jerene Pitch note, will do EMB and IUD. Pt not candidate for Paragard since it doesn't treat AUB/menorrhagia.

## 2021-03-18 ENCOUNTER — Other Ambulatory Visit: Payer: Self-pay

## 2021-03-18 ENCOUNTER — Encounter: Payer: Self-pay | Admitting: Obstetrics and Gynecology

## 2021-03-18 ENCOUNTER — Other Ambulatory Visit (HOSPITAL_COMMUNITY)
Admission: RE | Admit: 2021-03-18 | Discharge: 2021-03-18 | Disposition: A | Discharge status: Home / Self Care | Payer: BC Managed Care – PPO | Source: Ambulatory Visit | Attending: Obstetrics and Gynecology | Admitting: Obstetrics and Gynecology

## 2021-03-18 ENCOUNTER — Ambulatory Visit (INDEPENDENT_AMBULATORY_CARE_PROVIDER_SITE_OTHER): Payer: BC Managed Care – PPO | Admitting: Obstetrics and Gynecology

## 2021-03-18 VITALS — BP 130/80 | Ht 61.0 in | Wt 288.0 lb

## 2021-03-18 DIAGNOSIS — N939 Abnormal uterine and vaginal bleeding, unspecified: Secondary | ICD-10-CM | POA: Diagnosis not present

## 2021-03-18 DIAGNOSIS — Z30430 Encounter for insertion of intrauterine contraceptive device: Secondary | ICD-10-CM | POA: Diagnosis not present

## 2021-03-18 DIAGNOSIS — N8501 Benign endometrial hyperplasia: Secondary | ICD-10-CM | POA: Diagnosis not present

## 2021-03-18 MED ORDER — LEVONORGESTREL 20 MCG/DAY IU IUD: 1.0000 | INTRAUTERINE_SYSTEM | Freq: Once | INTRAUTERINE | 0 refills | Status: AC

## 2021-03-18 NOTE — Progress Notes (Signed)
   Chief Complaint  Patient presents with   Contraception    IUD insertion, EMB     IUD PROCEDURE NOTE:  Jaime Gilmore is a 45 y.o. G0P0000 here for Mirena  IUD insertion for menorrhagia/AUB. Menses have been worsening this yr. Had neg GYN u/s, EM=5.5 mm. Had planned to do hysteroscopy D&C with Dr. Jerene Pitch but would rather try IUD first. Had mirena in past with period control. Given hx of AUB and AGUS pap and risk of endometrial cancer, will do EMB with IDU insertion.  Pt is not sex active.  LMP 02/20/21 with several heavy days, then bleeding/spotting off and on since.   BP 130/80   Ht 5\' 1"  (1.549 m)   Wt 288 lb (130.6 kg)   BMI 54.42 kg/m    Endometrial Biopsy After discussion with the patient regarding her abnormal uterine bleeding I recommended that she proceed with an endometrial biopsy for further diagnosis. The risks, benefits, alternatives, and indications for an endometrial biopsy were discussed with the patient in detail. She understood the risks including infection, bleeding, cervical laceration and uterine perforation.  Verbal consent was obtained.   PROCEDURE NOTE:  [place] endometrial biopsy was performed using aseptic technique with iodine preparation.  The uterus was sounded to a length of 11.0 cm.  Adequate sampling was obtained with minimal blood loss.  The patient tolerated the procedure well.  Disposition will be pending pathology.  IUD Insertion Procedure Note Patient identified, informed consent performed, consent signed.   Discussed risks of irregular bleeding, cramping, infection, malpositioning or misplacement of the IUD outside the uterus which may require further procedure such as laparoscopy, risk of failure <1%. Time out was performed.    Speculum placed in the vagina.  Cervix visualized.  Cleaned with Betadine x 2.  Grasped anteriorly with a single tooth tenaculum.  Uterus sounded to 11.0 cm.   IUD placed per manufacturer's recommendations.  Strings  trimmed to 3 cm. Tenaculum was removed, good hemostasis noted.  Patient tolerated procedure well.   ASSESSMENT:  Abnormal uterine bleeding (AUB) - Plan: Surgical pathology; EMB today. Will treat with IUD.   Encounter for IUD insertion   Meds ordered this encounter  Medications   levonorgestrel (MIRENA) 20 MCG/DAY IUD    Sig: 1 each by Intrauterine route once for 1 dose.    Dispense:  1 each    Refill:  0    Order Specific Question:   Supervising Provider    Answer:   Nadara Mustard    Plan:  Patient was given post-procedure instructions.  She was advised to have backup contraception for one week.   Call if you are having increasing pain, cramps or bleeding or if you have a fever greater than 100.4 degrees F., shaking chills, nausea or vomiting. Patient was also asked to check IUD strings periodically and follow up in 4 weeks for IUD check.  Return in about 4 weeks (around 04/15/2021) for IUD f/u.  Jaime Gilmore B. Jaime Kozel, PA-C 03/18/2021 3:00 PM

## 2021-03-18 NOTE — Patient Instructions (Signed)
I value your feedback and you entrusting us with your care. If you get a [facility] patient survey, I would appreciate you taking the time to let us know about your experience today. Thank you!  [facility] [phone]  Instructions after IUD insertion  Most women experience no significant problems after insertion of an IUD, however minor cramping and spotting for a few days is common. Cramps may be treated with ibuprofen 800mg every 8 hours or Tylenol 650 mg every 4 hours. Contact [facility] immediately if you experience any of the following symptoms during the next week: temperature >99.6 degrees, worsening pelvic pain, abdominal pain, fainting, unusually heavy vaginal bleeding, foul vaginal discharge, or if you think you have expelled the IUD.  Nothing inserted in the vagina for 48 hours. You will be scheduled for a follow up visit in approximately four weeks.  You should check monthly to be sure you can feel the IUD strings in the upper vagina. If you are having a monthly period, try to check after each period. If you cannot feel the IUD strings,  contact [facility] immediately so we can do an exam to determine if the IUD has been expelled.   Please use backup protection until we can confirm the IUD is in place.  Call [facility] if you are exposed to or diagnosed with a sexually transmitted infection, as we will need to discuss whether it is safe for you to continue using an IUD.   

## 2021-03-19 ENCOUNTER — Other Ambulatory Visit: Payer: BC Managed Care – PPO

## 2021-03-19 LAB — SURGICAL PATHOLOGY

## 2021-03-24 ENCOUNTER — Encounter: Payer: BC Managed Care – PPO | Admitting: Obstetrics and Gynecology

## 2021-03-25 ENCOUNTER — Ambulatory Visit: Admit: 2021-03-25 | Payer: BC Managed Care – PPO | Admitting: Obstetrics and Gynecology

## 2021-03-25 SURGERY — DILATATION AND CURETTAGE /HYSTEROSCOPY
Anesthesia: Choice

## 2021-04-04 ENCOUNTER — Encounter: Payer: BC Managed Care – PPO | Admitting: Obstetrics and Gynecology

## 2021-04-05 ENCOUNTER — Other Ambulatory Visit: Payer: Self-pay | Admitting: Primary Care

## 2021-04-05 DIAGNOSIS — G4700 Insomnia, unspecified: Secondary | ICD-10-CM

## 2021-04-06 ENCOUNTER — Other Ambulatory Visit: Payer: Self-pay | Admitting: Primary Care

## 2021-04-06 DIAGNOSIS — E119 Type 2 diabetes mellitus without complications: Secondary | ICD-10-CM

## 2021-04-06 DIAGNOSIS — E785 Hyperlipidemia, unspecified: Secondary | ICD-10-CM

## 2021-04-06 DIAGNOSIS — E1169 Type 2 diabetes mellitus with other specified complication: Secondary | ICD-10-CM

## 2021-04-07 NOTE — Telephone Encounter (Signed)
Did not see where you have seen in office recently do you need to see before change or refill?

## 2021-04-08 ENCOUNTER — Other Ambulatory Visit: Payer: Self-pay | Admitting: Primary Care

## 2021-04-08 DIAGNOSIS — E1169 Type 2 diabetes mellitus with other specified complication: Secondary | ICD-10-CM

## 2021-04-08 MED ORDER — TIRZEPATIDE 5 MG/0.5ML ~~LOC~~ SOAJ: 5.0000 mg | SUBCUTANEOUS | 0 refills | Status: DC

## 2021-04-15 ENCOUNTER — Ambulatory Visit: Payer: BC Managed Care – PPO | Admitting: Obstetrics and Gynecology

## 2021-04-15 ENCOUNTER — Other Ambulatory Visit: Payer: Self-pay

## 2021-04-20 ENCOUNTER — Other Ambulatory Visit: Payer: Self-pay | Admitting: Primary Care

## 2021-04-20 DIAGNOSIS — F411 Generalized anxiety disorder: Secondary | ICD-10-CM

## 2021-04-25 ENCOUNTER — Ambulatory Visit: Payer: BC Managed Care – PPO | Admitting: Primary Care

## 2021-05-02 ENCOUNTER — Ambulatory Visit: Payer: BC Managed Care – PPO | Admitting: Primary Care

## 2021-05-02 ENCOUNTER — Other Ambulatory Visit: Payer: Self-pay

## 2021-05-02 ENCOUNTER — Encounter: Payer: Self-pay | Admitting: Primary Care

## 2021-05-02 DIAGNOSIS — G4700 Insomnia, unspecified: Secondary | ICD-10-CM

## 2021-05-02 DIAGNOSIS — E1169 Type 2 diabetes mellitus with other specified complication: Secondary | ICD-10-CM | POA: Diagnosis not present

## 2021-05-02 DIAGNOSIS — G8929 Other chronic pain: Secondary | ICD-10-CM

## 2021-05-02 DIAGNOSIS — F411 Generalized anxiety disorder: Secondary | ICD-10-CM

## 2021-05-02 DIAGNOSIS — M5442 Lumbago with sciatica, left side: Secondary | ICD-10-CM

## 2021-05-02 DIAGNOSIS — E785 Hyperlipidemia, unspecified: Secondary | ICD-10-CM

## 2021-05-02 DIAGNOSIS — M5441 Lumbago with sciatica, right side: Secondary | ICD-10-CM

## 2021-05-02 DIAGNOSIS — Z6841 Body Mass Index (BMI) 40.0 and over, adult: Secondary | ICD-10-CM

## 2021-05-02 LAB — COMPREHENSIVE METABOLIC PANEL
ALT: 29 U/L (ref 0–35)
AST: 19 U/L (ref 0–37)
Albumin: 4.2 g/dL (ref 3.5–5.2)
Alkaline Phosphatase: 88 U/L (ref 39–117)
BUN: 9 mg/dL (ref 6–23)
CO2: 25 mEq/L (ref 19–32)
Calcium: 9.2 mg/dL (ref 8.4–10.5)
Chloride: 102 mEq/L (ref 96–112)
Creatinine, Ser: 0.71 mg/dL (ref 0.40–1.20)
GFR: 100.99 mL/min (ref >60.00)
Glucose, Bld: 137 mg/dL — ABNORMAL HIGH (ref 70–99)
Potassium: 4.6 mEq/L (ref 3.5–5.1)
Sodium: 135 mEq/L (ref 135–145)
Total Bilirubin: 0.4 mg/dL (ref 0.2–1.2)
Total Protein: 7.2 g/dL (ref 6.0–8.3)

## 2021-05-02 LAB — LIPID PANEL
Cholesterol: 102 mg/dL (ref 0–200)
HDL: 38.8 mg/dL — ABNORMAL LOW (ref >39.00)
LDL Cholesterol: 34 mg/dL (ref 0–99)
NonHDL: 62.9
Total CHOL/HDL Ratio: 3
Triglycerides: 143 mg/dL (ref 0.0–149.0)
VLDL: 28.6 mg/dL (ref 0.0–40.0)

## 2021-05-02 LAB — CBC
HCT: 37 % (ref 36.0–46.0)
Hemoglobin: 12.3 g/dL (ref 12.0–15.0)
MCHC: 33.2 g/dL (ref 30.0–36.0)
MCV: 92 fl (ref 78.0–100.0)
Platelets: 328 10*3/uL (ref 150.0–400.0)
RBC: 4.02 Mil/uL (ref 3.87–5.11)
RDW: 13.6 % (ref 11.5–15.5)
WBC: 9.6 10*3/uL (ref 4.0–10.5)

## 2021-05-02 LAB — HEMOGLOBIN A1C: Hgb A1c MFr Bld: 7.5 % — ABNORMAL HIGH (ref 4.6–6.5)

## 2021-05-02 MED ORDER — TIRZEPATIDE 5 MG/0.5ML ~~LOC~~ SOAJ: 5.0000 mg | SUBCUTANEOUS | 2 refills | Status: DC

## 2021-05-02 NOTE — Assessment & Plan Note (Signed)
Well controlled on Lexapro 20 mg and Buspirone 10 mg TID, continue same.

## 2021-05-02 NOTE — Assessment & Plan Note (Signed)
Repeat A1C pending.  Continue Mounjaro 5 mg weekly.  Continue metformin XR 1000 mg daily.  Eye and foot exams UTD. Managed on statin and ACE-I Pneumonia vaccine UTD.  Follow up in 3 months.

## 2021-05-02 NOTE — Patient Instructions (Signed)
Stop by the lab prior to leaving today. I will notify you of your results once received.   Continue Mounjaro 5 mg weekly.  Increase your Trazodone to 100 mg nightly. Please update me.  It was a pleasure to see you today!

## 2021-05-02 NOTE — Assessment & Plan Note (Signed)
Compliant to atorvastatin 10 mg, continue same. Repeat lipid panel pending. 

## 2021-05-02 NOTE — Assessment & Plan Note (Signed)
Weight loss on home scale.  Continue Mounjaro 5 mg weekly for diabetes and weight.  Discussed to start exercising and working on diet.

## 2021-05-02 NOTE — Progress Notes (Signed)
Subjective:    Patient ID: Jaime Gilmore, female    DOB: 1976/01/15, 46 y.o.   MRN: [id number]  HPI  Jaime Gilmore is a very pleasant 46 y.o. female with a history of GERD, type 2 diabetes, PCOS, hyperlipidemia, type 2 diabetes, GAD, insomnia, morbid obesity who presents today for follow up of chronic conditions.  1) Type 2 Diabetes:   Current medications include: Mounjaro 5 mg weekly, metformin XR 1000 mg daily.  She is checking her blood glucose 0 times daily.  Last A1C: 7.1 in September 2022, due today Last Eye Exam: UTD Last Foot Exam: UTD Pneumonia Vaccination: 2019 Urine Microalbumin: None. Managed on ACE-I Statin: atorvastatin 10 mg  Dietary changes since last visit: Less snacking overall.    Exercise: No regular exercise.   2) Morbid Obesity: Currently prescribed Mounjaro 5 mg once weekly for diabetes and obesity.   She is weighing herself at home and is losing weight. She is losing 2 pounds weekly. Was at 290 lbs 2-3 weeks ago, is now 284 lbs today. The [place] has reduced her appetite. She is working on her diet, is not exercising.  She is needing a new handicap placard for her chronic back pain. She is working towards weight loss to help.  Wt Readings from Last 3 Encounters:  05/02/21 288 lb (130.6 kg)  03/18/21 288 lb (130.6 kg)  02/18/21 293 lb 6.4 oz (133.1 kg)   3) GAD/Insomnia: Currently managed on Lexapro 20 mg, Buspirone 10 mg TID, Trazodone 50 mg HS. Feels well managed on Lexapro 20 mg and Buspar 10 mg TID, but doesn't feel that Trazodone 50 mg is helping.   She has a lot of stress with work. Has difficulty falling asleep and staying asleep.      Review of Systems  Respiratory:  Negative for shortness of breath.   Cardiovascular:  Negative for chest pain.  Gastrointestinal:  Negative for abdominal pain and nausea.  Psychiatric/Behavioral:  Positive for sleep disturbance. The patient is not nervous/anxious.         Past Medical History:   Diagnosis Date   Anxiety    BRCA gene mutation negative in female 06/2015   Cervical high risk HPV (human papillomavirus) test positive 05/15/09;05/22/10;12/16/10;2014   hpv pos   Dysmenorrhea    Family history of breast cancer 06/2015   History of mammogram 02/19/2015   BIRAD I   History of Papanicolaou smear of cervix 06/25/2015   RNIL;NEG   Hyperlipidemia    Increased risk of breast cancer 08/2015   IBIS=24%   Insomnia    Onychomycosis 06/12/2014   Pap smear abnormality of cervix with ASCUS favoring dysplasia 05/22/10;12/16/10;11/27/11;05/30/12   Pap smear abnormality of cervix with LGSIL 05/15/2009   Polycystic ovaries    Type 2 diabetes mellitus (HCC)    Vitamin D deficiency     Social History   Socioeconomic History   Marital status: Married    Spouse name: Not on file   Number of children: 0   Years of education: 14   Highest education level: Not on file  Occupational History   Occupation: business  Tobacco Use   Smoking status: Every Day    Packs/day: 1.00    Types: Cigarettes   Smokeless tobacco: Never  Vaping Use   Vaping Use: Never used  Substance and Sexual Activity   Alcohol use: No   Drug use: No   Sexual activity: Not Currently    Birth control/protection: None  Other Topics Concern  Not on file  Social History Narrative   Married.   No children.    Works in [place].   Enjoys watching movies, swimming.    Social Determinants of Health   Financial Resource Strain: Not on file  Food Insecurity: Not on file  Transportation Needs: Not on file  Physical Activity: Not on file  Stress: Not on file  Social Connections: Not on file  Intimate Partner Violence: Not on file    Past Surgical History:  Procedure Laterality Date   CERVICAL BIOPSY  W/ LOOP ELECTRODE EXCISION  06/2012   PATH NEG   COLPOSCOPY  05/15/09;2/17;12/3; 5/13   NO LESIONS   INTRAUTERINE DEVICE (IUD) INSERTION  08/2007   IUD REMOVAL  05/30/2012   LAPAROSCOPIC CHOLECYSTECTOMY  2004    SKIN LESION EXCISION  2007   DERMATOFIBROMA    Family History  Problem Relation Age of Onset   Breast cancer Maternal Aunt 41       no contact   Breast cancer Mother 34   Anemia Mother    High Cholesterol Mother    Hypertension Father    Cancer Maternal Uncle        pancreatic   Diabetes Maternal Grandmother    Hypertension Paternal Grandmother    Hearing loss Paternal Grandmother    Heart disease Paternal Grandmother    High Cholesterol Paternal Grandmother    Cancer Cousin        ovarian- cured, no contact   Hearing loss Paternal Grandfather    Early death Paternal Grandfather     No Known Allergies  Current Outpatient Medications on File Prior to Visit  Medication Sig Dispense Refill   ALPRAZolam (XANAX) 0.5 MG tablet Take 1 tablet (0.5 mg total) by mouth daily as needed for anxiety. Use sparingly. (Patient taking differently: Take 0.5 mg by mouth daily as needed for anxiety. Use sparingly. Taking 1/4 tab daily) 30 tablet 0   atorvastatin (LIPITOR) 10 MG tablet TAKE 1 TABLET BY MOUTH EVERY DAY for cholesterol. Office visit required for further refills. 90 tablet 0   busPIRone (BUSPAR) 10 MG tablet TAKE 1 TABLET (10 MG TOTAL) BY MOUTH 3 (THREE) TIMES DAILY. FOR ANXIETY. 270 tablet 0   escitalopram (LEXAPRO) 20 MG tablet TAKE 1 TABLET (20 MG TOTAL) BY MOUTH DAILY. FOR ANXIETY. 90 tablet 2   levonorgestrel (MIRENA) 20 MCG/DAY IUD 1 each by Intrauterine route once for 1 dose. 1 each 0   lisinopril (ZESTRIL) 5 MG tablet TAKE 1 TABLET BY MOUTH EVERY DAY for blood pressure. 90 tablet 3   metFORMIN (GLUCOPHAGE-XR) 500 MG 24 hr tablet TAKE 2 TABLETS (1,000 MG TOTAL) BY MOUTH DAILY WITH BREAKFAST FOR DIABETES. (Patient taking differently: Take 1,000 mg by mouth daily with breakfast. For diabetes. One tablet in morning and one tablet at night) 180 tablet 3   tirzepatide (MOUNJARO) 5 MG/0.5ML Pen Inject 5 mg into the skin once a week. For diabetes. 2 mL 0   traZODone (DESYREL) 50 MG  tablet TAKE 1 TABLET BY MOUTH AT BEDTIME AS NEEDED FOR SLEEP 90 tablet 0   No current facility-administered medications on file prior to visit.    BP 126/78    Pulse 85    Temp (!) 97 F (36.1 C) (Temporal)    Ht 5' 1"  (1.549 m)    Wt 288 lb (130.6 kg)    SpO2 96%    BMI 54.42 kg/m  Objective:   Physical Exam Cardiovascular:     Rate and  Rhythm: Normal rate and regular rhythm.  Pulmonary:     Effort: Pulmonary effort is normal.     Breath sounds: Normal breath sounds.  Musculoskeletal:     Cervical back: Neck supple.  Skin:    General: Skin is warm and dry.          Assessment & Plan:      This visit occurred during the SARS-CoV-2 public health emergency.  Safety protocols were in place, including screening questions prior to the visit, additional usage of staff PPE, and extensive cleaning of exam room while observing appropriate contact time as indicated for disinfecting solutions.

## 2021-05-02 NOTE — Assessment & Plan Note (Signed)
No improvement with Trazodone 50 mg, will have her increase her dose to 100 mg. She will update.

## 2021-05-02 NOTE — Assessment & Plan Note (Signed)
Stable.  Lost handicap placard. New one provided.

## 2021-05-09 ENCOUNTER — Ambulatory Visit (INDEPENDENT_AMBULATORY_CARE_PROVIDER_SITE_OTHER): Payer: BC Managed Care – PPO | Admitting: Obstetrics and Gynecology

## 2021-05-09 ENCOUNTER — Other Ambulatory Visit: Payer: Self-pay

## 2021-05-09 ENCOUNTER — Encounter: Payer: Self-pay | Admitting: Obstetrics and Gynecology

## 2021-05-09 VITALS — BP 120/84 | Ht 61.0 in | Wt 288.0 lb

## 2021-05-09 DIAGNOSIS — N939 Abnormal uterine and vaginal bleeding, unspecified: Secondary | ICD-10-CM | POA: Diagnosis not present

## 2021-05-09 DIAGNOSIS — Z30431 Encounter for routine checking of intrauterine contraceptive device: Secondary | ICD-10-CM | POA: Diagnosis not present

## 2021-05-09 MED ORDER — NORETHINDRONE ACETATE 5 MG PO TABS: 5.0000 mg | ORAL_TABLET | Freq: Every day | ORAL | 0 refills | Status: DC

## 2021-05-09 NOTE — Progress Notes (Signed)
Chief Complaint  Patient presents with   IUD check    No concerns     History of Present Illness:  Jaime Gilmore is a 46 y.o. that had a Mirena IUD placed approximately 1 1/2 months ago. Since that time, she has continued to have almost daily bleeding, changing products QD to TID, with smaller clots. Also with dysmen/LLQ pain, sometimes worse than before IUD. Improved with NSAIDs.  Mirena IUD insertion for menorrhagia/AUB. Menses have been worsening this yr. Had neg GYN u/s 10/22, EM=5.5 mm. Had planned to do hysteroscopy D&C with Dr. Gilman Schmidt but would rather try IUD first. Had mirena in past with period control. Given hx of AUB and AGUS pap and risk of endometrial cancer, EMB done 11/22 that showed simple hyperplasia without atypia; no malignancy.   Past Medical History:  Diagnosis Date   Anxiety    BRCA gene mutation negative in female 06/2015   Cervical high risk HPV (human papillomavirus) test positive 05/15/09;05/22/10;12/16/10;2014   hpv pos   Dysmenorrhea    Family history of breast cancer 06/2015   History of mammogram 02/19/2015   BIRAD I   History of Papanicolaou smear of cervix 06/25/2015   RNIL;NEG   Hyperlipidemia    Increased risk of breast cancer 08/2015   IBIS=24%   Insomnia    Onychomycosis 06/12/2014   Pap smear abnormality of cervix with ASCUS favoring dysplasia 05/22/10;12/16/10;11/27/11;05/30/12   Pap smear abnormality of cervix with LGSIL 05/15/2009   Polycystic ovaries    Type 2 diabetes mellitus ([place])    Vitamin D deficiency    Past Surgical History:  Procedure Laterality Date   CERVICAL BIOPSY  W/ LOOP ELECTRODE EXCISION  06/2012   PATH NEG   COLPOSCOPY  05/15/09;2/17;12/3; 5/13   NO LESIONS   INTRAUTERINE DEVICE (IUD) INSERTION  08/2007   IUD REMOVAL  05/30/2012   LAPAROSCOPIC CHOLECYSTECTOMY  2004   SKIN LESION EXCISION  2007   DERMATOFIBROMA   Family History  Problem Relation Age of Onset   Breast cancer Maternal Aunt 26       no contact    Breast cancer Mother 45   Anemia Mother    High Cholesterol Mother    Hypertension Father    Cancer Maternal Uncle        pancreatic   Diabetes Maternal Grandmother    Hypertension Paternal Grandmother    Hearing loss Paternal Grandmother    Heart disease Paternal Grandmother    High Cholesterol Paternal Grandmother    Cancer Cousin        ovarian- cured, no contact   Hearing loss Paternal Grandfather    Early death Paternal Grandfather     Review of Systems  Constitutional:  Negative for fever.  Gastrointestinal:  Negative for blood in stool, constipation, diarrhea, nausea and vomiting.  Genitourinary:  Positive for menstrual problem. Negative for dyspareunia, dysuria, flank pain, frequency, hematuria, urgency, vaginal bleeding, vaginal discharge and vaginal pain.  Musculoskeletal:  Negative for back pain.  Skin:  Negative for rash.   Physical Exam:  BP 120/84    Ht 5' 1"  (1.549 m)    Wt 288 lb (130.6 kg)    BMI 54.42 kg/m  Body mass index is 54.42 kg/m.  Physical Exam Constitutional:      General: She is not in acute distress. Genitourinary:     Vulva normal.     Right Labia: No rash, tenderness or lesions.    Left Labia: No tenderness, lesions or rash.  Vaginal bleeding present.     No vaginal discharge, erythema or tenderness.      Right Adnexa: not tender and no mass present.    Left Adnexa: not tender and no mass present.    No cervical motion tenderness or friability.     IUD strings visualized.     Uterus is not enlarged or tender.  Pulmonary:     Effort: Pulmonary effort is normal.  Musculoskeletal:        General: Normal range of motion.  Neurological:     General: No focal deficit present.     Mental Status: She is alert.     Cranial Nerves: No cranial nerve deficit.  Skin:    General: Skin is warm and dry.  Psychiatric:        Mood and Affect: Mood normal.        Behavior: Behavior normal.        Thought Content: Thought content normal.         Judgment: Judgment normal.  Vitals and nursing note reviewed.     Assessment:   Encounter for routine checking of intrauterine contraceptive device (IUD)--IUD strings present in proper location  Abnormal uterine bleeding (AUB) - Plan: norethindrone (AYGESTIN) 5 MG tablet; try aygestin for 10 days given persistent bleeding. Sx most likely normal as body adjusts to IUD. If sx persist at 3 months, will check GYN u/s for IUD placement. F/u prn.   Meds ordered this encounter  Medications   norethindrone (AYGESTIN) 5 MG tablet    Sig: Take 1 tablet (5 mg total) by mouth daily for 10 days.    Dispense:  10 tablet    Refill:  0    Order Specific Question:   Supervising Provider    Answer:   Gae Dry [[id number]]   Return if symptoms worsen or fail to improve.   [name] B. Jyron Turman, PA-C 05/09/2021 9:18 AM

## 2021-05-28 DIAGNOSIS — F411 Generalized anxiety disorder: Secondary | ICD-10-CM

## 2021-05-28 MED ORDER — ALPRAZOLAM 0.5 MG PO TABS: 0.5000 mg | ORAL_TABLET | Freq: Every day | ORAL | 0 refills | Status: DC | PRN

## 2021-06-06 ENCOUNTER — Ambulatory Visit: Payer: BC Managed Care – PPO | Admitting: Primary Care

## 2021-06-06 ENCOUNTER — Encounter: Payer: Self-pay | Admitting: Primary Care

## 2021-06-06 ENCOUNTER — Other Ambulatory Visit: Payer: Self-pay

## 2021-06-06 VITALS — BP 122/84 | HR 114 | Temp 98.7°F | Ht 61.0 in | Wt 285.0 lb

## 2021-06-06 DIAGNOSIS — G8929 Other chronic pain: Secondary | ICD-10-CM | POA: Diagnosis not present

## 2021-06-06 DIAGNOSIS — M5441 Lumbago with sciatica, right side: Secondary | ICD-10-CM

## 2021-06-06 DIAGNOSIS — M25569 Pain in unspecified knee: Secondary | ICD-10-CM | POA: Insufficient documentation

## 2021-06-06 DIAGNOSIS — M25561 Pain in right knee: Secondary | ICD-10-CM | POA: Diagnosis not present

## 2021-06-06 DIAGNOSIS — M5442 Lumbago with sciatica, left side: Secondary | ICD-10-CM | POA: Diagnosis not present

## 2021-06-06 HISTORY — DX: Pain in unspecified knee: M25.569

## 2021-06-06 LAB — URIC ACID: Uric Acid, Serum: 7.4 mg/dL — ABNORMAL HIGH (ref 2.4–7.0)

## 2021-06-06 MED ORDER — METHYLPREDNISOLONE ACETATE 80 MG/ML IJ SUSP
80.0000 mg | Freq: Once | INTRAMUSCULAR | Status: AC
  Administered 2021-06-06: 80 mg via INTRAMUSCULAR

## 2021-06-06 MED ORDER — GABAPENTIN 100 MG PO CAPS: 100.0000 mg | ORAL_CAPSULE | Freq: Every day | ORAL | 0 refills | Status: DC

## 2021-06-06 NOTE — Addendum Note (Signed)
Addended by: Donnamarie Poag on: 06/06/2021 04:04 PM   Modules accepted: Orders

## 2021-06-06 NOTE — Assessment & Plan Note (Signed)
Suspect this is secondary to arthritis, especially given her chronic lower back pain and the lack of any injury/trauma.  Exam reassuring for dislocation and tendon/ligament tear.  We will rule out gout today with uric acid which is pending.  We will treat with Depo-Medrol 80 mg IM and also gabapentin 100 to 300 mg at bedtime.  She will update if no improvement.

## 2021-06-06 NOTE — Assessment & Plan Note (Signed)
Chronic and continued, now with right lower extremity and knee pain for which is likely related.  We will treat knee pain acutely with Depo-Medrol 80 mg IM today.  Prescription for gabapentin 100 to 300 mg sent to pharmacy to take at bedtime.  She will update.  Drowsiness precautions provided

## 2021-06-06 NOTE — Progress Notes (Signed)
Subjective:    Patient ID: Jaime Gilmore, female    DOB: 1975-09-23, 46 y.o.   MRN: [id number]  HPI  Jaime Gilmore is a very pleasant 46 y.o. female with a history of morbid obesity, chronic back pain, right knee dislocation who presents today to discuss knee pain.  Her pain is located to the right anterior knee with radiation to the popliteal region which began about 3 weeks ago.   Her pain is constant, worse with certain movements and walking. She describes her pain as a pulling and aching sensation. She feels like her knee joint will pop out of place.   Sometimes she will notice right lower extremity pain when laying down. Cannot get comfortable. She continues with chronic bilateral right sided back pain.  Pain occurs daily and is intermittent throughout the day.  She is working on weight loss.  She denies injury/trauma, overexertion, redness, prior history of gout. The morning prior she did go to [employer] and playing with her niece, this may have aggravated her knee.   She's taking Advil and Tylenol, BioFreeze, Aspercream with temporary improvement.   BP Readings from Last 3 Encounters:  06/06/21 122/84  05/09/21 120/84  05/02/21 126/78   Wt Readings from Last 3 Encounters:  06/06/21 285 lb (129.3 kg)  05/09/21 288 lb (130.6 kg)  05/02/21 288 lb (130.6 kg)      Review of Systems  Musculoskeletal:  Positive for arthralgias, back pain and joint swelling.  Skin:  Negative for color change.  Neurological:  Negative for numbness.        Past Medical History:  Diagnosis Date   Anxiety    BRCA gene mutation negative in female 06/2015   Cervical high risk HPV (human papillomavirus) test positive 05/15/09;05/22/10;12/16/10;2014   hpv pos   Dysmenorrhea    Family history of breast cancer 06/2015   History of mammogram 02/19/2015   BIRAD I   History of Papanicolaou smear of cervix 06/25/2015   RNIL;NEG   Hyperlipidemia    Increased risk of breast cancer 08/2015    IBIS=24%   Insomnia    Onychomycosis 06/12/2014   Pap smear abnormality of cervix with ASCUS favoring dysplasia 05/22/10;12/16/10;11/27/11;05/30/12   Pap smear abnormality of cervix with LGSIL 05/15/2009   Polycystic ovaries    Type 2 diabetes mellitus ([facility])    Vitamin D deficiency     Social History   Socioeconomic History   Marital status: Married    Spouse name: Not on file   Number of children: 0   Years of education: 14   Highest education level: Not on file  Occupational History   Occupation: business  Tobacco Use   Smoking status: Every Day    Packs/day: 1.00    Types: Cigarettes   Smokeless tobacco: Never  Vaping Use   Vaping Use: Never used  Substance and Sexual Activity   Alcohol use: No   Drug use: No   Sexual activity: Not Currently    Birth control/protection: I.U.D.    Comment: Mirena  Other Topics Concern   Not on file  Social History Narrative   Married.   No children.    Works in [place].   Enjoys watching movies, swimming.    Social Determinants of Health   Financial Resource Strain: Not on file  Food Insecurity: Not on file  Transportation Needs: Not on file  Physical Activity: Not on file  Stress: Not on file  Social Connections: Not on file  Intimate Partner  Violence: Not on file    Past Surgical History:  Procedure Laterality Date   CERVICAL BIOPSY  W/ LOOP ELECTRODE EXCISION  06/2012   PATH NEG   COLPOSCOPY  05/15/09;2/17;12/3; 5/13   NO LESIONS   INTRAUTERINE DEVICE (IUD) INSERTION  08/2007   IUD REMOVAL  05/30/2012   LAPAROSCOPIC CHOLECYSTECTOMY  2004   SKIN LESION EXCISION  2007   DERMATOFIBROMA    Family History  Problem Relation Age of Onset   Breast cancer Maternal Aunt 63       no contact   Breast cancer Mother 18   Anemia Mother    High Cholesterol Mother    Hypertension Father    Cancer Maternal Uncle        pancreatic   Diabetes Maternal Grandmother    Hypertension Paternal Grandmother    Hearing loss Paternal  Grandmother    Heart disease Paternal Grandmother    High Cholesterol Paternal Grandmother    Cancer Cousin        ovarian- cured, no contact   Hearing loss Paternal Grandfather    Early death Paternal Grandfather     No Known Allergies  Current Outpatient Medications on File Prior to Visit  Medication Sig Dispense Refill   ALPRAZolam (XANAX) 0.5 MG tablet Take 1 tablet (0.5 mg total) by mouth daily as needed for anxiety. Use sparingly. 10 tablet 0   atorvastatin (LIPITOR) 10 MG tablet TAKE 1 TABLET BY MOUTH EVERY DAY for cholesterol. Office visit required for further refills. 90 tablet 0   busPIRone (BUSPAR) 10 MG tablet TAKE 1 TABLET (10 MG TOTAL) BY MOUTH 3 (THREE) TIMES DAILY. FOR ANXIETY. 270 tablet 0   escitalopram (LEXAPRO) 20 MG tablet TAKE 1 TABLET (20 MG TOTAL) BY MOUTH DAILY. FOR ANXIETY. 90 tablet 2   levonorgestrel (MIRENA) 20 MCG/DAY IUD 1 each by Intrauterine route once for 1 dose. 1 each 0   lisinopril (ZESTRIL) 5 MG tablet TAKE 1 TABLET BY MOUTH EVERY DAY for blood pressure. 90 tablet 3   metFORMIN (GLUCOPHAGE-XR) 500 MG 24 hr tablet TAKE 2 TABLETS (1,000 MG TOTAL) BY MOUTH DAILY WITH BREAKFAST FOR DIABETES. (Patient taking differently: Take 1,000 mg by mouth daily with breakfast. For diabetes. One tablet in morning and one tablet at night) 180 tablet 3   tirzepatide (MOUNJARO) 5 MG/0.5ML Pen Inject 5 mg into the skin once a week. For diabetes. 2 mL 2   traZODone (DESYREL) 50 MG tablet TAKE 1 TABLET BY MOUTH AT BEDTIME AS NEEDED FOR SLEEP 90 tablet 0   No current facility-administered medications on file prior to visit.    BP 122/84    Pulse (!) 114    Temp 98.7 F (37.1 C) (Temporal)    Ht 5' 1"  (1.549 m)    Wt 285 lb (129.3 kg)    SpO2 96%    BMI 53.85 kg/m  Objective:   Physical Exam Constitutional:      General: She is not in acute distress. Pulmonary:     Effort: Pulmonary effort is normal.  Musculoskeletal:     Right knee: Swelling present. No erythema or  bony tenderness. Decreased range of motion.     Left knee: No swelling or bony tenderness. Normal range of motion.       Legs:     Comments: Mild swelling to anterior right knee  Skin:    General: Skin is warm and dry.     Findings: No erythema.  Neurological:  Mental Status: She is alert.          Assessment & Plan:      This visit occurred during the SARS-CoV-2 public health emergency.  Safety protocols were in place, including screening questions prior to the visit, additional usage of staff PPE, and extensive cleaning of exam room while observing appropriate contact time as indicated for disinfecting solutions.

## 2021-06-06 NOTE — Patient Instructions (Signed)
Stop by the lab prior to leaving today. I will notify you of your results once received.   Start gabapentin for back pain and knee pain.  Take 1 to 3 capsules by mouth at bedtime for pain.  This may cause drowsiness.  It was a pleasure to see you today!

## 2021-06-11 ENCOUNTER — Other Ambulatory Visit: Payer: Self-pay | Admitting: Primary Care

## 2021-06-11 DIAGNOSIS — G4700 Insomnia, unspecified: Secondary | ICD-10-CM

## 2021-06-12 ENCOUNTER — Other Ambulatory Visit: Payer: Self-pay | Admitting: Primary Care

## 2021-06-12 DIAGNOSIS — G4700 Insomnia, unspecified: Secondary | ICD-10-CM

## 2021-06-12 MED ORDER — TRAZODONE HCL 100 MG PO TABS: 100.0000 mg | ORAL_TABLET | Freq: Every day | ORAL | 2 refills | Status: DC

## 2021-06-25 DIAGNOSIS — E119 Type 2 diabetes mellitus without complications: Secondary | ICD-10-CM | POA: Diagnosis not present

## 2021-06-25 DIAGNOSIS — E785 Hyperlipidemia, unspecified: Secondary | ICD-10-CM | POA: Diagnosis not present

## 2021-06-25 DIAGNOSIS — E1169 Type 2 diabetes mellitus with other specified complication: Secondary | ICD-10-CM

## 2021-06-25 DIAGNOSIS — Z713 Dietary counseling and surveillance: Secondary | ICD-10-CM | POA: Diagnosis not present

## 2021-06-25 DIAGNOSIS — K219 Gastro-esophageal reflux disease without esophagitis: Secondary | ICD-10-CM | POA: Diagnosis not present

## 2021-06-25 DIAGNOSIS — R29898 Other symptoms and signs involving the musculoskeletal system: Secondary | ICD-10-CM | POA: Diagnosis not present

## 2021-06-25 DIAGNOSIS — Z6841 Body Mass Index (BMI) 40.0 and over, adult: Secondary | ICD-10-CM | POA: Diagnosis not present

## 2021-06-26 NOTE — Telephone Encounter (Signed)
Needs office visit. Please schedule.

## 2021-06-27 MED ORDER — TIRZEPATIDE 7.5 MG/0.5ML ~~LOC~~ SOAJ: 7.5000 mg | SUBCUTANEOUS | 0 refills | Status: DC

## 2021-06-28 ENCOUNTER — Other Ambulatory Visit: Payer: Self-pay | Admitting: Primary Care

## 2021-06-28 DIAGNOSIS — E1169 Type 2 diabetes mellitus with other specified complication: Secondary | ICD-10-CM

## 2021-06-29 NOTE — Telephone Encounter (Signed)
Can we verify if her Greggory Keen (for diabetes) is on back order for this strength? ?Any other strengths in stock?  ?Any medication similar in stock at this dose? ?

## 2021-07-01 NOTE — Telephone Encounter (Signed)
Called patient is is for Diabetes. Her insurance does not cover but pharmacy has a coupon that will lower cost to $25 a month. They do not have any in stock of any dose and do not know when if they will get any in. Once it is received they will fill any pending scripts by date received. There are no pharmacy in are that are [facility] that have any in stock of any dose that are not accounted for by current patient.  ?

## 2021-07-01 NOTE — Telephone Encounter (Signed)
Noted. MyChart message sent to patient

## 2021-07-02 MED ORDER — SEMAGLUTIDE (1 MG/DOSE) 4 MG/3ML ~~LOC~~ SOPN: 1.0000 mg | PEN_INJECTOR | SUBCUTANEOUS | 0 refills | Status: DC

## 2021-07-03 ENCOUNTER — Telehealth: Payer: Self-pay | Admitting: Pharmacist

## 2021-07-03 ENCOUNTER — Other Ambulatory Visit: Payer: Self-pay | Admitting: Primary Care

## 2021-07-03 NOTE — Telephone Encounter (Signed)
Noted. Joellen, FYI. 

## 2021-07-03 NOTE — Telephone Encounter (Signed)
Ozempic copay card obtained: ? ?BIN: [id number] ?PCN: [id number] ?ID: [id number] ?GRP: [id number] ? ?Per [facility], the Ozempic needs a PA first.  ?Submitted PA through covermymeds: Key: [id number] ?Received immediate response that PA was approved 07/03/21-07/02/22. ? ?Informed pharmacy of PA approval, their system has not updated quite yet so they will try to process later this afternoon. ? ? ? ?

## 2021-07-14 DIAGNOSIS — Z7689 Persons encountering health services in other specified circumstances: Secondary | ICD-10-CM | POA: Diagnosis not present

## 2021-07-14 DIAGNOSIS — M47814 Spondylosis without myelopathy or radiculopathy, thoracic region: Secondary | ICD-10-CM | POA: Diagnosis not present

## 2021-07-14 DIAGNOSIS — E119 Type 2 diabetes mellitus without complications: Secondary | ICD-10-CM | POA: Diagnosis not present

## 2021-07-14 DIAGNOSIS — M549 Dorsalgia, unspecified: Secondary | ICD-10-CM | POA: Diagnosis not present

## 2021-07-14 DIAGNOSIS — Z8742 Personal history of other diseases of the female genital tract: Secondary | ICD-10-CM | POA: Diagnosis not present

## 2021-07-14 DIAGNOSIS — G8929 Other chronic pain: Secondary | ICD-10-CM | POA: Diagnosis not present

## 2021-07-14 DIAGNOSIS — F411 Generalized anxiety disorder: Secondary | ICD-10-CM | POA: Diagnosis not present

## 2021-07-14 DIAGNOSIS — Z01818 Encounter for other preprocedural examination: Secondary | ICD-10-CM | POA: Diagnosis not present

## 2021-07-14 DIAGNOSIS — K219 Gastro-esophageal reflux disease without esophagitis: Secondary | ICD-10-CM | POA: Diagnosis not present

## 2021-07-14 DIAGNOSIS — E785 Hyperlipidemia, unspecified: Secondary | ICD-10-CM | POA: Diagnosis not present

## 2021-07-17 ENCOUNTER — Other Ambulatory Visit: Payer: Self-pay | Admitting: Primary Care

## 2021-07-17 DIAGNOSIS — G8929 Other chronic pain: Secondary | ICD-10-CM

## 2021-07-19 ENCOUNTER — Other Ambulatory Visit: Payer: Self-pay | Admitting: Primary Care

## 2021-07-19 DIAGNOSIS — F411 Generalized anxiety disorder: Secondary | ICD-10-CM

## 2021-07-22 ENCOUNTER — Other Ambulatory Visit: Payer: Self-pay | Admitting: Primary Care

## 2021-07-22 DIAGNOSIS — F411 Generalized anxiety disorder: Secondary | ICD-10-CM

## 2021-07-30 ENCOUNTER — Other Ambulatory Visit: Payer: Self-pay | Admitting: Primary Care

## 2021-07-30 DIAGNOSIS — E1169 Type 2 diabetes mellitus with other specified complication: Secondary | ICD-10-CM

## 2021-07-31 ENCOUNTER — Ambulatory Visit: Payer: BC Managed Care – PPO | Admitting: Primary Care

## 2021-07-31 ENCOUNTER — Encounter: Payer: Self-pay | Admitting: Primary Care

## 2021-07-31 VITALS — BP 124/82 | HR 76 | Temp 98.0°F | Ht 61.0 in | Wt 277.0 lb

## 2021-07-31 DIAGNOSIS — Z6841 Body Mass Index (BMI) 40.0 and over, adult: Secondary | ICD-10-CM

## 2021-07-31 DIAGNOSIS — E1169 Type 2 diabetes mellitus with other specified complication: Secondary | ICD-10-CM

## 2021-07-31 LAB — POCT GLYCOSYLATED HEMOGLOBIN (HGB A1C): Hemoglobin A1C: 6.5 % — AB (ref 4.0–5.6)

## 2021-07-31 MED ORDER — SEMAGLUTIDE (2 MG/DOSE) 8 MG/3ML ~~LOC~~ SOPN: 2.0000 mg | PEN_INJECTOR | SUBCUTANEOUS | 0 refills | Status: DC

## 2021-07-31 NOTE — Progress Notes (Signed)
? ?Subjective:  ? ? Patient ID: Jaime Gilmore, female    DOB: Jan 04, 1976, 46 y.o.   MRN: [id number] ? ?HPI ? ?Jaime Gilmore is a very pleasant 46 y.o. female with a history of type 2 diabetes, PCOS, hyperlipidemia, chronic back pain, morbid obesity who presents today for follow-up of diabetes and morbid obesity. ? ?She was last seen in the clinic for diabetes follow-up in January 2023, A1c of 7.5.  Our plan was to increase her Mounjaro to 7.5 mg weekly, however this dose was unavailable from her pharmacy.  She is currently managed on semaglutide 1 mg once weekly and metformin XR 1000 mg daily for diabetes. ? ?Since her last visit she has consulted with a bariatric surgeon and is contemplating weight loss surgery, SIPS.  She has met with a nutritionist and a Ambulance person. She underwent extensive lab testing on 07/14/2021 and is pending an upper endoscopy for later this month.  ? ?During her visit in with [place] in February for acute knee pain she was found to have a new diagnosis of gout. She was treated in the office, prior to diagnosis discovery, with IM Depo Medrol and later found improvement with treatment.  ? ?Today she endorses compliance to Semaglutide 1 mg weekly, no negative effects. She continues to lose weight based on her home scales.  ? ?Diet currently consists of: 1500 calories per day ? ?Breakfast: Eggs, yogurt, applesauce, or muscle milk protein shake, fruit ?Lunch: muscle milk, fruit, cheese, nuts, deli meat ?Dinner: grilled chicken, rice, vegetables, lean cuisine   ?Snacks: fruit, yogurt ?Desserts: Infrequently  ?Beverages: Water, protein shakes, diet soda, skinny water ? ?Exercise: None ? ? ?Wt Readings from Last 3 Encounters:  ?07/31/21 277 lb (125.6 kg)  ?06/06/21 285 lb (129.3 kg)  ?05/09/21 288 lb (130.6 kg)  ? ? ? ?Review of Systems  ?Eyes:  Negative for visual disturbance.  ?Respiratory:  Negative for shortness of breath.   ?Cardiovascular:  Negative for chest pain.  ?Neurological:   Negative for numbness.  ? ?   ? ? ?Past Medical History:  ?Diagnosis Date  ? Anxiety   ? BRCA gene mutation negative in female 06/2015  ? Cervical high risk HPV (human papillomavirus) test positive 05/15/09;05/22/10;12/16/10;2014  ? hpv pos  ? COVID-19 virus infection 02/05/2020  ? Dysmenorrhea   ? Family history of breast cancer 06/2015  ? History of mammogram 02/19/2015  ? BIRAD I  ? History of Papanicolaou smear of cervix 06/25/2015  ? RNIL;NEG  ? Hyperlipidemia   ? Increased risk of breast cancer 08/2015  ? IBIS=24%  ? Insomnia   ? Onychomycosis 06/12/2014  ? Pap smear abnormality of cervix with ASCUS favoring dysplasia 05/22/10;12/16/10;11/27/11;05/30/12  ? Pap smear abnormality of cervix with LGSIL 05/15/2009  ? Polycystic ovaries   ? Type 2 diabetes mellitus ([place])   ? Vitamin D deficiency   ? ? ?Social History  ? ?Socioeconomic History  ? Marital status: Married  ?  Spouse name: Not on file  ? Number of children: 0  ? Years of education: 63  ? Highest education level: Not on file  ?Occupational History  ? Occupation: business  ?Tobacco Use  ? Smoking status: Every Day  ?  Packs/day: 1.00  ?  Types: Cigarettes  ? Smokeless tobacco: Never  ?Vaping Use  ? Vaping Use: Never used  ?Substance and Sexual Activity  ? Alcohol use: No  ? Drug use: No  ? Sexual activity: Not Currently  ?  Birth  control/protection: I.U.D.  ?  Comment: Mirena  ?Other Topics Concern  ? Not on file  ?Social History Narrative  ? Married.  ? No children.   ? Works in [place].  ? Enjoys watching movies, swimming.   ? ?Social Determinants of Health  ? ?Financial Resource Strain: Not on file  ?Food Insecurity: Not on file  ?Transportation Needs: Not on file  ?Physical Activity: Not on file  ?Stress: Not on file  ?Social Connections: Not on file  ?Intimate Partner Violence: Not on file  ? ? ?Past Surgical History:  ?Procedure Laterality Date  ? CERVICAL BIOPSY  W/ LOOP ELECTRODE EXCISION  06/2012  ? PATH NEG  ? COLPOSCOPY  05/15/09;2/17;12/3; 5/13  ? NO LESIONS   ? INTRAUTERINE DEVICE (IUD) INSERTION  08/2007  ? IUD REMOVAL  05/30/2012  ? LAPAROSCOPIC CHOLECYSTECTOMY  2004  ? SKIN LESION EXCISION  2007  ? DERMATOFIBROMA  ? ? ?Family History  ?Problem Relation Age of Onset  ? Breast cancer Maternal Aunt 40  ?     no contact  ? Breast cancer Mother 73  ? Anemia Mother   ? High Cholesterol Mother   ? Hypertension Father   ? Cancer Maternal Uncle   ?     pancreatic  ? Diabetes Maternal Grandmother   ? Hypertension Paternal Grandmother   ? Hearing loss Paternal Grandmother   ? Heart disease Paternal Grandmother   ? High Cholesterol Paternal Grandmother   ? Cancer Cousin   ?     ovarian- cured, no contact  ? Hearing loss Paternal Grandfather   ? Early death Paternal Grandfather   ? ? ?No Known Allergies ? ?Current Outpatient Medications on File Prior to Visit  ?Medication Sig Dispense Refill  ? ALPRAZolam (XANAX) 0.5 MG tablet Take 1 tablet (0.5 mg total) by mouth daily as needed for anxiety. Use sparingly. 10 tablet 0  ? atorvastatin (LIPITOR) 10 MG tablet TAKE 1 TABLET BY MOUTH EVERY DAY for cholesterol. 90 tablet 2  ? busPIRone (BUSPAR) 10 MG tablet TAKE 1 TABLET (10 MG TOTAL) BY MOUTH 3 (THREE) TIMES DAILY. FOR ANXIETY. 270 tablet 2  ? escitalopram (LEXAPRO) 20 MG tablet TAKE 1 TABLET (20 MG TOTAL) BY MOUTH DAILY. FOR ANXIETY. 90 tablet 2  ? gabapentin (NEURONTIN) 100 MG capsule Take 1-2 capsules (100-200 mg total) by mouth at bedtime. For pain 180 capsule 0  ? levonorgestrel (MIRENA) 20 MCG/DAY IUD 1 each by Intrauterine route once for 1 dose. 1 each 0  ? lisinopril (ZESTRIL) 5 MG tablet TAKE 1 TABLET BY MOUTH EVERY DAY for blood pressure. 90 tablet 3  ? metFORMIN (GLUCOPHAGE-XR) 500 MG 24 hr tablet TAKE 2 TABLETS (1,000 MG TOTAL) BY MOUTH DAILY WITH BREAKFAST FOR DIABETES. 180 tablet 1  ? traZODone (DESYREL) 100 MG tablet Take 1 tablet (100 mg total) by mouth at bedtime. For sleep. 90 tablet 2  ? ?No current facility-administered medications on file prior to visit.  ? ? ?BP  124/82   Pulse 76   Temp 98 ?F (36.7 ?C) (Oral)   Ht 5' 1"  (1.549 m)   Wt 277 lb (125.6 kg)   SpO2 97%   BMI 52.34 kg/m?  ?Objective:  ? Physical Exam ?Cardiovascular:  ?   Rate and Rhythm: Normal rate and regular rhythm.  ?Pulmonary:  ?   Effort: Pulmonary effort is normal.  ?   Breath sounds: Normal breath sounds.  ?Musculoskeletal:  ?   Cervical back: Neck supple.  ?Skin: ?  General: Skin is warm and dry.  ?Psychiatric:     ?   Mood and Affect: Mood normal.  ? ? ? ? ? ?   ?Assessment & Plan:  ? ? ? ? ?This visit occurred during the SARS-CoV-2 public health emergency.  Safety protocols were in place, including screening questions prior to the visit, additional usage of staff PPE, and extensive cleaning of exam room while observing appropriate contact time as indicated for disinfecting solutions.  ?

## 2021-07-31 NOTE — Assessment & Plan Note (Addendum)
Improved with A1C of 6.5 today! ? ?Increase Ozempic to 2 mg weekly for continued glycemic control and weight loss.Marland Kitchen ?Continue metformin XR 1000 mg daily. ? ?Managed on statin and ACE-I. ?Pneumonia vaccine UTD. ? ?Eye exam UTD. ?Foot exam today. ? ?Follow up in 3 months. ?

## 2021-07-31 NOTE — Patient Instructions (Signed)
We increased your dose of Ozempic to 2 mg weekly. ? ?Continue to work on [employer]. ? ?Please schedule a follow up visit for 3 months. ? ?It was a pleasure to see you today! ? ? ?

## 2021-07-31 NOTE — Assessment & Plan Note (Signed)
Improved with weight loss of 11 pounds since January 2023! Commended her on dietary changes. ? ?Reviewed office notes from [facility] from Bariatric Surgery. Agree with her decision to pursue surgery, long discussion regarding the need to continue to work on her diet. ? ?Recommended she start walking daily.  ? ?

## 2021-08-14 DIAGNOSIS — Z79899 Other long term (current) drug therapy: Secondary | ICD-10-CM | POA: Diagnosis not present

## 2021-08-14 DIAGNOSIS — Z01818 Encounter for other preprocedural examination: Secondary | ICD-10-CM | POA: Diagnosis not present

## 2021-08-14 DIAGNOSIS — K449 Diaphragmatic hernia without obstruction or gangrene: Secondary | ICD-10-CM | POA: Diagnosis not present

## 2021-08-14 DIAGNOSIS — Z7984 Long term (current) use of oral hypoglycemic drugs: Secondary | ICD-10-CM | POA: Diagnosis not present

## 2021-08-14 DIAGNOSIS — Z793 Long term (current) use of hormonal contraceptives: Secondary | ICD-10-CM | POA: Diagnosis not present

## 2021-08-14 DIAGNOSIS — E119 Type 2 diabetes mellitus without complications: Secondary | ICD-10-CM | POA: Diagnosis not present

## 2021-08-14 DIAGNOSIS — E282 Polycystic ovarian syndrome: Secondary | ICD-10-CM | POA: Diagnosis not present

## 2021-08-14 DIAGNOSIS — K219 Gastro-esophageal reflux disease without esophagitis: Secondary | ICD-10-CM | POA: Diagnosis not present

## 2021-08-14 DIAGNOSIS — E785 Hyperlipidemia, unspecified: Secondary | ICD-10-CM | POA: Diagnosis not present

## 2021-08-14 DIAGNOSIS — Z6841 Body Mass Index (BMI) 40.0 and over, adult: Secondary | ICD-10-CM | POA: Diagnosis not present

## 2021-09-13 ENCOUNTER — Other Ambulatory Visit: Payer: Self-pay | Admitting: Primary Care

## 2021-09-13 DIAGNOSIS — E1169 Type 2 diabetes mellitus with other specified complication: Secondary | ICD-10-CM

## 2021-09-17 DIAGNOSIS — M25561 Pain in right knee: Secondary | ICD-10-CM

## 2021-09-24 ENCOUNTER — Other Ambulatory Visit: Payer: Self-pay | Admitting: Primary Care

## 2021-09-24 DIAGNOSIS — F411 Generalized anxiety disorder: Secondary | ICD-10-CM | POA: Diagnosis not present

## 2021-09-24 DIAGNOSIS — E119 Type 2 diabetes mellitus without complications: Secondary | ICD-10-CM | POA: Diagnosis not present

## 2021-09-24 DIAGNOSIS — F17200 Nicotine dependence, unspecified, uncomplicated: Secondary | ICD-10-CM | POA: Diagnosis not present

## 2021-09-24 DIAGNOSIS — K219 Gastro-esophageal reflux disease without esophagitis: Secondary | ICD-10-CM | POA: Diagnosis not present

## 2021-09-28 ENCOUNTER — Other Ambulatory Visit: Payer: Self-pay | Admitting: Primary Care

## 2021-09-28 DIAGNOSIS — G8929 Other chronic pain: Secondary | ICD-10-CM

## 2021-10-03 NOTE — Telephone Encounter (Signed)
Please print handicap paperwork and place in Jaime Gilmore's box for her return

## 2021-10-06 ENCOUNTER — Other Ambulatory Visit: Payer: Self-pay | Admitting: Primary Care

## 2021-10-06 DIAGNOSIS — F411 Generalized anxiety disorder: Secondary | ICD-10-CM

## 2021-10-06 NOTE — Telephone Encounter (Signed)
Filled out placed in [place] box for review.

## 2021-10-24 ENCOUNTER — Other Ambulatory Visit: Payer: Self-pay | Admitting: Primary Care

## 2021-10-24 DIAGNOSIS — E1169 Type 2 diabetes mellitus with other specified complication: Secondary | ICD-10-CM

## 2021-10-24 DIAGNOSIS — E119 Type 2 diabetes mellitus without complications: Secondary | ICD-10-CM

## 2021-10-24 MED ORDER — SEMAGLUTIDE (2 MG/DOSE) 8 MG/3ML ~~LOC~~ SOPN: 2.0000 mg | PEN_INJECTOR | SUBCUTANEOUS | 0 refills | Status: DC

## 2021-10-30 ENCOUNTER — Ambulatory Visit: Payer: BC Managed Care – PPO | Admitting: Primary Care

## 2021-10-30 ENCOUNTER — Encounter: Payer: Self-pay | Admitting: Primary Care

## 2021-10-30 VITALS — BP 124/76 | HR 82 | Temp 98.6°F | Ht 61.0 in | Wt 277.0 lb

## 2021-10-30 DIAGNOSIS — E1169 Type 2 diabetes mellitus with other specified complication: Secondary | ICD-10-CM | POA: Diagnosis not present

## 2021-10-30 DIAGNOSIS — F411 Generalized anxiety disorder: Secondary | ICD-10-CM | POA: Diagnosis not present

## 2021-10-30 DIAGNOSIS — Z6841 Body Mass Index (BMI) 40.0 and over, adult: Secondary | ICD-10-CM

## 2021-10-30 DIAGNOSIS — E119 Type 2 diabetes mellitus without complications: Secondary | ICD-10-CM | POA: Diagnosis not present

## 2021-10-30 LAB — POCT GLYCOSYLATED HEMOGLOBIN (HGB A1C): Hemoglobin A1C: 6.2 % — AB (ref 4.0–5.6)

## 2021-10-30 MED ORDER — METFORMIN HCL ER 500 MG PO TB24: ORAL_TABLET | ORAL | 1 refills | Status: DC

## 2021-10-30 MED ORDER — ALPRAZOLAM 0.5 MG PO TABS: 0.5000 mg | ORAL_TABLET | Freq: Every day | ORAL | 0 refills | Status: DC | PRN

## 2021-10-30 MED ORDER — BUSPIRONE HCL 15 MG PO TABS: 15.0000 mg | ORAL_TABLET | Freq: Three times a day (TID) | ORAL | 1 refills | Status: DC

## 2021-10-30 NOTE — Progress Notes (Signed)
Subjective:    Patient ID: Jaime Gilmore, female    DOB: 02-10-1976, 47 y.o.   MRN: [id number]  Diabetes Hypoglycemia symptoms include nervousness/anxiousness. Pertinent negatives for diabetes include no chest pain.    JAKEYA Gilmore is a very pleasant 46 y.o. female with a history of type 2 diabetes, PCOS, hyperlipidemia, morbid obesity, chronic back pain who presents today for follow-up of morbid obesity, anxiety, and diabetes.  She is also requesting a refill of her alprazolam.   1) Type 2 Diabetes: Currently managed on Ozempic 2 mg weekly,metformin XR 1000 mg in AM and 500 mg HS. Last A1C from April 2023 was 6.5. Metformin was increased a few months ago to 1000 mg in AM and 500 mg in PM.   She continues to notice numbness to her lower extremities that occurs with standing for prolonged periods of time. Secondary to chronic back pain.  2) Morbid Obesity:Following with bariatric surgery through [facility] and is pending laparoscopic single anastomosis duodenal-ileal bypass with sleeve gastrectomy for 11/20/2021.  She has struggled with obesity for most of her life, struggles with "will power". No recent exercise due to back pain. She is scheduled for pre-op testing next week and will be starting her 800 calorie diet at that time prepare for her upcoming surgery. She is ready to proceed.   3) GAD: She continues to struggle with chronic anxiety which waxes and wanes. This week was a bad week with anxiety attacks daily. She is under a tremendous stress with work, her boss is the source for most of her anxiety. She will be switching departments soon which brings mixed emotions. She's ready to be away from her boss but she sees this move as a demotion.   She is managed on Lexapro 20 mg which continues to help, and buspirone 10 mg TID which has been helpful. She is requesting a refill of her Xanax for which she takes 1/4 tablet as needed.   BP Readings from Last 3 Encounters:  10/30/21 124/76   07/31/21 124/82  06/06/21 122/84   Wt Readings from Last 3 Encounters:  10/30/21 277 lb (125.6 kg)  07/31/21 277 lb (125.6 kg)  06/06/21 285 lb (129.3 kg)      Review of Systems  Respiratory:  Negative for shortness of breath.   Cardiovascular:  Negative for chest pain.  Musculoskeletal:  Positive for back pain.  Neurological:  Positive for numbness.  Psychiatric/Behavioral:  The patient is nervous/anxious.          Past Medical History:  Diagnosis Date   Anxiety    BRCA gene mutation negative in female 06/2015   Cervical high risk HPV (human papillomavirus) test positive 05/15/09;05/22/10;12/16/10;2014   hpv pos   COVID-19 virus infection 02/05/2020   Dysmenorrhea    Family history of breast cancer 06/2015   History of mammogram 02/19/2015   BIRAD I   History of Papanicolaou smear of cervix 06/25/2015   RNIL;NEG   Hyperlipidemia    Increased risk of breast cancer 08/2015   IBIS=24%   Insomnia    Onychomycosis 06/12/2014   Pap smear abnormality of cervix with ASCUS favoring dysplasia 05/22/10;12/16/10;11/27/11;05/30/12   Pap smear abnormality of cervix with LGSIL 05/15/2009   Polycystic ovaries    Type 2 diabetes mellitus (HCC)    Vitamin D deficiency     Social History   Socioeconomic History   Marital status: Married    Spouse name: Not on file   Number of children: 0  Years of education: 52   Highest education level: Not on file  Occupational History   Occupation: business  Tobacco Use   Smoking status: Every Day    Packs/day: 1.00    Types: Cigarettes   Smokeless tobacco: Never  Vaping Use   Vaping Use: Never used  Substance and Sexual Activity   Alcohol use: No   Drug use: No   Sexual activity: Not Currently    Birth control/protection: I.U.D.    Comment: Mirena  Other Topics Concern   Not on file  Social History Narrative   Married.   No children.    Works in [place].   Enjoys watching movies, swimming.    Social Determinants of Health    Financial Resource Strain: Not on file  Food Insecurity: Not on file  Transportation Needs: Not on file  Physical Activity: Not on file  Stress: Not on file  Social Connections: Not on file  Intimate Partner Violence: Not on file    Past Surgical History:  Procedure Laterality Date   CERVICAL BIOPSY  W/ LOOP ELECTRODE EXCISION  06/2012   PATH NEG   COLPOSCOPY  05/15/09;2/17;12/3; 5/13   NO LESIONS   INTRAUTERINE DEVICE (IUD) INSERTION  08/2007   IUD REMOVAL  05/30/2012   LAPAROSCOPIC CHOLECYSTECTOMY  2004   SKIN LESION EXCISION  2007   DERMATOFIBROMA    Family History  Problem Relation Age of Onset   Breast cancer Maternal Aunt 45       no contact   Breast cancer Mother 34   Anemia Mother    High Cholesterol Mother    Hypertension Father    Cancer Maternal Uncle        pancreatic   Diabetes Maternal Grandmother    Hypertension Paternal Grandmother    Hearing loss Paternal Grandmother    Heart disease Paternal Grandmother    High Cholesterol Paternal Grandmother    Cancer Cousin        ovarian- cured, no contact   Hearing loss Paternal Grandfather    Early death Paternal Grandfather     No Known Allergies  Current Outpatient Medications on File Prior to Visit  Medication Sig Dispense Refill   atorvastatin (LIPITOR) 10 MG tablet TAKE 1 TABLET BY MOUTH EVERY DAY for cholesterol. 90 tablet 2   escitalopram (LEXAPRO) 20 MG tablet TAKE 1 TABLET (20 MG TOTAL) BY MOUTH DAILY. FOR ANXIETY. 90 tablet 2   gabapentin (NEURONTIN) 100 MG capsule TAKE 1-2 CAPSULES (100-200 MG TOTAL) BY MOUTH AT BEDTIME. FOR PAIN 180 capsule 0   levonorgestrel (MIRENA) 20 MCG/DAY IUD 1 each by Intrauterine route once for 1 dose. 1 each 0   lisinopril (ZESTRIL) 5 MG tablet TAKE 1 TABLET BY MOUTH EVERY DAY FOR BLOOD PRESSURE 90 tablet 0   Semaglutide, 2 MG/DOSE, 8 MG/3ML SOPN Inject 2 mg as directed once a week. For diabetes. 9 mL 0   traZODone (DESYREL) 100 MG tablet Take 1 tablet (100 mg  total) by mouth at bedtime. For sleep. 90 tablet 2   No current facility-administered medications on file prior to visit.    BP 124/76   Pulse 82   Temp 98.6 F (37 C) (Oral)   Ht 5' 1" (1.549 m)   Wt 277 lb (125.6 kg)   SpO2 96%   BMI 52.34 kg/m  Objective:   Physical Exam Cardiovascular:     Rate and Rhythm: Normal rate and regular rhythm.  Pulmonary:     Effort: Pulmonary effort  is normal.     Breath sounds: Normal breath sounds.  Musculoskeletal:     Cervical back: Neck supple.  Skin:    General: Skin is warm and dry.  Psychiatric:        Mood and Affect: Mood normal.           Assessment & Plan:   Problem List Items Addressed This Visit       Endocrine   Type 2 diabetes mellitus ([place]) - Primary    Improved with A1c of 6.2 today!  Continue Ozempic 2 mg weekly, metformin 1000 mg every morning and 500 mg every evening. Managed on statin and ACE inhibitor.  She will be proceeding with bariatric surgery soon and will update if her medications are discontinued. We will follow up after her surgery.      Relevant Medications   metFORMIN (GLUCOPHAGE-XR) 500 MG 24 hr tablet   Other Relevant Orders   POCT glycosylated hemoglobin (Hb A1C) (Completed)     Other   GAD (generalized anxiety disorder)    Uncontrolled.  Continue Lexapro 20 mg as this has been helpful overall. Increase buspirone to 15 mg 3 times daily.  She does not need a new prescription at this time, but she will notify when ready.  Agreed to provide refill of alprazolam to use sparingly PRN.   She will update.      Relevant Medications   busPIRone (BUSPAR) 15 MG tablet   ALPRAZolam (XANAX) 0.5 MG tablet   Morbid obesity with BMI of 50.0-59.9, adult (HCC)    No weight change since last visit.  She is pending bariatric surgery for 11/20/2021. She will update if medications were discontinued.        Relevant Medications   metFORMIN (GLUCOPHAGE-XR) 500 MG 24 hr tablet        Pleas Koch, NP

## 2021-10-30 NOTE — Assessment & Plan Note (Signed)
No weight change since last visit.  She is pending bariatric surgery for 11/20/2021. She will update if medications were discontinued.

## 2021-10-30 NOTE — Patient Instructions (Addendum)
We increased your buspirone to 15 mg three times daily.  Continue metformin and Ozempic for now.  Please update me as discussed.  It was a pleasure to see you today!

## 2021-10-30 NOTE — Assessment & Plan Note (Signed)
Uncontrolled.  Continue Lexapro 20 mg as this has been helpful overall. Increase buspirone to 15 mg 3 times daily.  She does not need a new prescription at this time, but she will notify when ready.  Agreed to provide refill of alprazolam to use sparingly PRN.   She will update.

## 2021-10-30 NOTE — Assessment & Plan Note (Signed)
Improved with A1c of 6.2 today!  Continue Ozempic 2 mg weekly, metformin 1000 mg every morning and 500 mg every evening. Managed on statin and ACE inhibitor.  She will be proceeding with bariatric surgery soon and will update if her medications are discontinued. We will follow up after her surgery.

## 2021-11-05 DIAGNOSIS — K219 Gastro-esophageal reflux disease without esophagitis: Secondary | ICD-10-CM | POA: Diagnosis not present

## 2021-11-05 DIAGNOSIS — Z713 Dietary counseling and surveillance: Secondary | ICD-10-CM | POA: Diagnosis not present

## 2021-11-05 DIAGNOSIS — F17290 Nicotine dependence, other tobacco product, uncomplicated: Secondary | ICD-10-CM | POA: Diagnosis not present

## 2021-11-05 DIAGNOSIS — Z01818 Encounter for other preprocedural examination: Secondary | ICD-10-CM | POA: Diagnosis not present

## 2021-11-05 DIAGNOSIS — Z79899 Other long term (current) drug therapy: Secondary | ICD-10-CM | POA: Diagnosis not present

## 2021-11-05 DIAGNOSIS — Z6841 Body Mass Index (BMI) 40.0 and over, adult: Secondary | ICD-10-CM | POA: Diagnosis not present

## 2021-11-05 DIAGNOSIS — E119 Type 2 diabetes mellitus without complications: Secondary | ICD-10-CM | POA: Diagnosis not present

## 2021-11-12 DIAGNOSIS — Z6841 Body Mass Index (BMI) 40.0 and over, adult: Secondary | ICD-10-CM | POA: Diagnosis not present

## 2021-11-12 DIAGNOSIS — E119 Type 2 diabetes mellitus without complications: Secondary | ICD-10-CM | POA: Diagnosis not present

## 2021-11-12 DIAGNOSIS — F411 Generalized anxiety disorder: Secondary | ICD-10-CM | POA: Diagnosis not present

## 2021-11-12 DIAGNOSIS — K219 Gastro-esophageal reflux disease without esophagitis: Secondary | ICD-10-CM | POA: Diagnosis not present

## 2021-11-12 DIAGNOSIS — E1169 Type 2 diabetes mellitus with other specified complication: Secondary | ICD-10-CM | POA: Diagnosis not present

## 2021-11-12 DIAGNOSIS — E785 Hyperlipidemia, unspecified: Secondary | ICD-10-CM | POA: Diagnosis not present

## 2021-11-12 DIAGNOSIS — Z01818 Encounter for other preprocedural examination: Secondary | ICD-10-CM | POA: Diagnosis not present

## 2021-11-20 DIAGNOSIS — F17290 Nicotine dependence, other tobacco product, uncomplicated: Secondary | ICD-10-CM | POA: Diagnosis not present

## 2021-11-20 DIAGNOSIS — K219 Gastro-esophageal reflux disease without esophagitis: Secondary | ICD-10-CM | POA: Diagnosis not present

## 2021-11-20 DIAGNOSIS — E785 Hyperlipidemia, unspecified: Secondary | ICD-10-CM | POA: Diagnosis not present

## 2021-11-20 DIAGNOSIS — Z6841 Body Mass Index (BMI) 40.0 and over, adult: Secondary | ICD-10-CM | POA: Diagnosis not present

## 2021-11-20 DIAGNOSIS — M1990 Unspecified osteoarthritis, unspecified site: Secondary | ICD-10-CM | POA: Diagnosis not present

## 2021-11-20 DIAGNOSIS — Z79899 Other long term (current) drug therapy: Secondary | ICD-10-CM | POA: Diagnosis not present

## 2021-11-20 DIAGNOSIS — Z7984 Long term (current) use of oral hypoglycemic drugs: Secondary | ICD-10-CM | POA: Diagnosis not present

## 2021-11-20 DIAGNOSIS — E119 Type 2 diabetes mellitus without complications: Secondary | ICD-10-CM | POA: Diagnosis not present

## 2021-11-20 DIAGNOSIS — F411 Generalized anxiety disorder: Secondary | ICD-10-CM | POA: Diagnosis not present

## 2021-11-21 HISTORY — PX: GASTRIC BYPASS: SHX52

## 2021-12-06 ENCOUNTER — Other Ambulatory Visit: Payer: Self-pay | Admitting: Primary Care

## 2021-12-06 DIAGNOSIS — G8929 Other chronic pain: Secondary | ICD-10-CM

## 2021-12-06 DIAGNOSIS — E119 Type 2 diabetes mellitus without complications: Secondary | ICD-10-CM

## 2021-12-09 DIAGNOSIS — Z9884 Bariatric surgery status: Secondary | ICD-10-CM | POA: Diagnosis not present

## 2021-12-09 DIAGNOSIS — F17290 Nicotine dependence, other tobacco product, uncomplicated: Secondary | ICD-10-CM | POA: Diagnosis not present

## 2021-12-09 DIAGNOSIS — E119 Type 2 diabetes mellitus without complications: Secondary | ICD-10-CM

## 2021-12-09 DIAGNOSIS — Z713 Dietary counseling and surveillance: Secondary | ICD-10-CM | POA: Diagnosis not present

## 2021-12-09 DIAGNOSIS — Z48815 Encounter for surgical aftercare following surgery on the digestive system: Secondary | ICD-10-CM | POA: Diagnosis not present

## 2021-12-09 DIAGNOSIS — F411 Generalized anxiety disorder: Secondary | ICD-10-CM | POA: Diagnosis not present

## 2021-12-09 DIAGNOSIS — K912 Postsurgical malabsorption, not elsewhere classified: Secondary | ICD-10-CM | POA: Diagnosis not present

## 2021-12-09 DIAGNOSIS — Z6841 Body Mass Index (BMI) 40.0 and over, adult: Secondary | ICD-10-CM | POA: Diagnosis not present

## 2021-12-09 MED ORDER — METFORMIN HCL ER 500 MG PO TB24: ORAL_TABLET | ORAL | 0 refills | Status: DC

## 2022-01-27 ENCOUNTER — Other Ambulatory Visit (HOSPITAL_BASED_OUTPATIENT_CLINIC_OR_DEPARTMENT_OTHER): Payer: Self-pay

## 2022-01-27 MED ORDER — FLUARIX QUADRIVALENT 0.5 ML IM SUSY
PREFILLED_SYRINGE | INTRAMUSCULAR | 0 refills | Status: DC
  Filled 2022-01-27: qty 0.5, 1d supply, fill #0

## 2022-01-30 ENCOUNTER — Encounter: Payer: Self-pay | Admitting: Primary Care

## 2022-01-30 ENCOUNTER — Ambulatory Visit (INDEPENDENT_AMBULATORY_CARE_PROVIDER_SITE_OTHER): Payer: BC Managed Care – PPO | Admitting: Primary Care

## 2022-01-30 VITALS — BP 122/74 | HR 88 | Temp 97.2°F | Ht 61.0 in | Wt 236.0 lb

## 2022-01-30 DIAGNOSIS — E1169 Type 2 diabetes mellitus with other specified complication: Secondary | ICD-10-CM

## 2022-01-30 DIAGNOSIS — E119 Type 2 diabetes mellitus without complications: Secondary | ICD-10-CM

## 2022-01-30 DIAGNOSIS — F411 Generalized anxiety disorder: Secondary | ICD-10-CM | POA: Diagnosis not present

## 2022-01-30 DIAGNOSIS — Z6841 Body Mass Index (BMI) 40.0 and over, adult: Secondary | ICD-10-CM

## 2022-01-30 LAB — POCT GLYCOSYLATED HEMOGLOBIN (HGB A1C): Hemoglobin A1C: 5.4 % (ref 4.0–5.6)

## 2022-01-30 LAB — MICROALBUMIN / CREATININE URINE RATIO
Creatinine,U: 158.6 mg/dL
Microalb Creat Ratio: 2.3 mg/g (ref 0.0–30.0)
Microalb, Ur: 3.7 mg/dL — ABNORMAL HIGH (ref 0.0–1.9)

## 2022-01-30 MED ORDER — METFORMIN HCL ER 500 MG PO TB24: 1000.0000 mg | ORAL_TABLET | Freq: Every day | ORAL | 0 refills | Status: DC

## 2022-01-30 NOTE — Assessment & Plan Note (Signed)
Improved.  Continue Lexapro 20 mg daily. Continue Buspirone 30 mg in AM and 15 mg in PM.

## 2022-01-30 NOTE — Progress Notes (Signed)
Subjective:    Patient ID: Jaime Gilmore, female    DOB: [birth year], 46 y.o.   MRN: [id number]  HPI  Jaime Gilmore is a very pleasant 46 y.o. female with a history of type 2 diabetes, hyperlipidemia, morbid obesity, GAD who presents today for follow-up.  1) Morbid Obesity: Status post SADI-S per Dr. Flavia Shipper on 11/20/21.  Currently following with bariatric services and nutritionist through [facility].  Since her surgery She has lost approx. 40 pounds! She is following with nutrition services every 3 months. She has increased protein consumption. Is trying to walk but has been limited because of her back pain. She has noticed that she cannot eat large portions as it will cause nausea and/or vomiting.   Wt Readings from Last 3 Encounters:  01/30/22 236 lb (107 kg)  10/30/21 277 lb (125.6 kg)  07/31/21 277 lb (125.6 kg)     2) Type 2 Diabetes:   Current medications include: Ozempic 2 mg weekly, metformin 1000 mg in a.m. and 500 mg in p.m.  She is checking her blood glucose 0 times daily.  Last A1C: 6.2 in July 2023, 5.4 today  Last Eye Exam: Due Last Foot Exam: Up-to-date Pneumonia Vaccination: 2019 Urine Microalbumin: Due Statin: Atorvastatin  Dietary changes since last visit: Increased protein, small portion sizes, fruit, some veggies. Following with nutritionist through [facility].    Exercise: No regular exercise  3) GAD: Currently managed on Lexapro 20 mg daily, buspirone 15 mg 3 times daily, and alprazolam as needed.  During her last visit in July 2023 we increased her buspirone to 15 mg 3 times daily given ongoing anxiety symptoms.  I refill of her alprazolam (for which she uses sparingly) was provided.  Since her last visit she's feeling better. Her work stress has decreased since moving to another department. She is taking 30 mg of buspirone in the morning and 15 mg of buspirone in the evening and has noticed improvement. She continues with her Lexapro 20  mg.    BP Readings from Last 3 Encounters:  01/30/22 122/74  10/30/21 124/76  07/31/21 124/82     Review of Systems  Respiratory:  Negative for shortness of breath.   Cardiovascular:  Negative for chest pain.  Musculoskeletal:  Positive for arthralgias and back pain.  Neurological:  Negative for numbness.  Psychiatric/Behavioral:  The patient is not nervous/anxious.          Past Medical History:  Diagnosis Date   Anxiety    BRCA gene mutation negative in female 06/2015   Cervical high risk HPV (human papillomavirus) test positive 05/15/09;05/22/10;12/16/10;2014   hpv pos   COVID-19 virus infection 02/05/2020   Dysmenorrhea    Family history of breast cancer 06/2015   History of mammogram 02/19/2015   BIRAD I   History of Papanicolaou smear of cervix 06/25/2015   RNIL;NEG   Hyperlipidemia    Increased risk of breast cancer 08/2015   IBIS=24%   Insomnia    Onychomycosis 06/12/2014   Pap smear abnormality of cervix with ASCUS favoring dysplasia 05/22/10;12/16/10;11/27/11;05/30/12   Pap smear abnormality of cervix with LGSIL 05/15/2009   Polycystic ovaries    Type 2 diabetes mellitus (HCC)    Vitamin D deficiency     Social History   Socioeconomic History   Marital status: Married    Spouse name: Not on file   Number of children: 0   Years of education: 14   Highest education level: Not on file  Occupational History   Occupation: business  Tobacco Use   Smoking status: Every Day    Packs/day: 1.00    Types: Cigarettes   Smokeless tobacco: Never  Vaping Use   Vaping Use: Never used  Substance and Sexual Activity   Alcohol use: No   Drug use: No   Sexual activity: Not Currently    Birth control/protection: I.U.D.    Comment: Mirena  Other Topics Concern   Not on file  Social History Narrative   Married.   No children.    Works in [place].   Enjoys watching movies, swimming.    Social Determinants of Health   Financial Resource Strain: Not on file  Food  Insecurity: Not on file  Transportation Needs: Not on file  Physical Activity: Not on file  Stress: Not on file  Social Connections: Not on file  Intimate Partner Violence: Not on file    Past Surgical History:  Procedure Laterality Date   CERVICAL BIOPSY  W/ LOOP ELECTRODE EXCISION  06/2012   PATH NEG   COLPOSCOPY  05/15/09;2/17;12/3; 5/13   NO LESIONS   INTRAUTERINE DEVICE (IUD) INSERTION  08/2007   IUD REMOVAL  05/30/2012   LAPAROSCOPIC CHOLECYSTECTOMY  2004   SKIN LESION EXCISION  2007   DERMATOFIBROMA    Family History  Problem Relation Age of Onset   Breast cancer Maternal Aunt 60       no contact   Breast cancer Mother 19   Anemia Mother    High Cholesterol Mother    Hypertension Father    Cancer Maternal Uncle        pancreatic   Diabetes Maternal Grandmother    Hypertension Paternal Grandmother    Hearing loss Paternal Grandmother    Heart disease Paternal Grandmother    High Cholesterol Paternal Grandmother    Cancer Cousin        ovarian- cured, no contact   Hearing loss Paternal Grandfather    Early death Paternal Grandfather     No Known Allergies  Current Outpatient Medications on File Prior to Visit  Medication Sig Dispense Refill   ALPRAZolam (XANAX) 0.5 MG tablet Take 1 tablet (0.5 mg total) by mouth daily as needed for anxiety. Use sparingly. 10 tablet 0   atorvastatin (LIPITOR) 10 MG tablet TAKE 1 TABLET BY MOUTH EVERY DAY for cholesterol. 90 tablet 2   busPIRone (BUSPAR) 15 MG tablet Take 1 tablet (15 mg total) by mouth 3 (three) times daily. For anxiety. 270 tablet 1   escitalopram (LEXAPRO) 20 MG tablet TAKE 1 TABLET (20 MG TOTAL) BY MOUTH DAILY. FOR ANXIETY. 90 tablet 2   gabapentin (NEURONTIN) 100 MG capsule TAKE 1-2 CAPSULES (100-200 MG TOTAL) BY MOUTH AT BEDTIME. FOR PAIN 180 capsule 0   traZODone (DESYREL) 100 MG tablet Take 1 tablet (100 mg total) by mouth at bedtime. For sleep. 90 tablet 2   influenza vac split quadrivalent PF (FLUARIX  QUADRIVALENT) 0.5 ML injection Inject into the muscle. (Patient not taking: Reported on 01/30/2022) 0.5 mL 0   levonorgestrel (MIRENA) 20 MCG/DAY IUD 1 each by Intrauterine route once for 1 dose. 1 each 0   No current facility-administered medications on file prior to visit.    BP 122/74   Pulse 88   Temp (!) 97.2 F (36.2 C) (Temporal)   Ht 5' 1"  (1.549 m)   Wt 236 lb (107 kg)   SpO2 94%   BMI 44.59 kg/m  Objective:   Physical Exam Cardiovascular:  Rate and Rhythm: Normal rate and regular rhythm.  Pulmonary:     Effort: Pulmonary effort is normal.     Breath sounds: Normal breath sounds.  Musculoskeletal:     Cervical back: Neck supple.  Skin:    General: Skin is warm and dry.  Psychiatric:        Mood and Affect: Mood normal.           Assessment & Plan:   Problem List Items Addressed This Visit       Endocrine   Type 2 diabetes mellitus ([place]) - Primary    Controlled with A1C of 5.4 today!  Remain off of Ozempic as she's not taken in nearly 3 months. Commended her on weight loss!  Discontinue lisinopril 5 mg. Reduce metformin XR to 1000 mg in AM. Stop 500 mg in PM.  Urine microalbumin due and pending. Follow up in 3 months.      Relevant Medications   metFORMIN (GLUCOPHAGE-XR) 500 MG 24 hr tablet   Other Relevant Orders   Microalbumin/Creatinine Ratio, Urine   POCT glycosylated hemoglobin (Hb A1C)     Other   GAD (generalized anxiety disorder)    Improved.  Continue Lexapro 20 mg daily. Continue Buspirone 30 mg in AM and 15 mg in PM.      Morbid obesity with BMI of 40.0-44.9, adult ([place])    Commended her on her weight loss, encouraged to continue!  Following with [facility].  Reviewed office notes from August 2023 through [facility].      Relevant Medications   metFORMIN (GLUCOPHAGE-XR) 500 MG 24 hr tablet       Pleas Koch, NP

## 2022-01-30 NOTE — Assessment & Plan Note (Signed)
Commended her on her weight loss, encouraged to continue!  Following with [facility].  Reviewed office notes from August 2023 through [facility].

## 2022-01-30 NOTE — Assessment & Plan Note (Signed)
Controlled with A1C of 5.4 today!  Remain off of Ozempic as she's not taken in nearly 3 months. Commended her on weight loss!  Discontinue lisinopril 5 mg. Reduce metformin XR to 1000 mg in AM. Stop 500 mg in PM.  Urine microalbumin due and pending. Follow up in 3 months.

## 2022-01-30 NOTE — Patient Instructions (Signed)
Stop by the lab prior to leaving today. I will notify you of your results once received.   Stop lisinopril when you run out.  Reduce metformin to 2 tablets in the morning. Stop your evening dose.  Please schedule a follow up visit for 3 months.  It was a pleasure to see you today!

## 2022-02-03 ENCOUNTER — Ambulatory Visit: Payer: BC Managed Care – PPO | Admitting: Primary Care

## 2022-03-04 DIAGNOSIS — Z7984 Long term (current) use of oral hypoglycemic drugs: Secondary | ICD-10-CM | POA: Diagnosis not present

## 2022-03-04 DIAGNOSIS — K912 Postsurgical malabsorption, not elsewhere classified: Secondary | ICD-10-CM | POA: Diagnosis not present

## 2022-03-04 DIAGNOSIS — Z9884 Bariatric surgery status: Secondary | ICD-10-CM | POA: Diagnosis not present

## 2022-03-04 DIAGNOSIS — Z6841 Body Mass Index (BMI) 40.0 and over, adult: Secondary | ICD-10-CM | POA: Diagnosis not present

## 2022-03-04 DIAGNOSIS — K219 Gastro-esophageal reflux disease without esophagitis: Secondary | ICD-10-CM | POA: Diagnosis not present

## 2022-03-04 DIAGNOSIS — Z793 Long term (current) use of hormonal contraceptives: Secondary | ICD-10-CM | POA: Diagnosis not present

## 2022-03-04 DIAGNOSIS — Z713 Dietary counseling and surveillance: Secondary | ICD-10-CM | POA: Diagnosis not present

## 2022-03-04 DIAGNOSIS — Z79899 Other long term (current) drug therapy: Secondary | ICD-10-CM | POA: Diagnosis not present

## 2022-03-05 ENCOUNTER — Other Ambulatory Visit: Payer: Self-pay | Admitting: Primary Care

## 2022-03-05 DIAGNOSIS — E119 Type 2 diabetes mellitus without complications: Secondary | ICD-10-CM

## 2022-03-18 ENCOUNTER — Other Ambulatory Visit: Payer: Self-pay | Admitting: Primary Care

## 2022-03-18 DIAGNOSIS — G4700 Insomnia, unspecified: Secondary | ICD-10-CM

## 2022-04-02 ENCOUNTER — Other Ambulatory Visit: Payer: Self-pay | Admitting: Primary Care

## 2022-04-02 DIAGNOSIS — F411 Generalized anxiety disorder: Secondary | ICD-10-CM

## 2022-04-02 DIAGNOSIS — E1169 Type 2 diabetes mellitus with other specified complication: Secondary | ICD-10-CM

## 2022-04-02 DIAGNOSIS — E785 Hyperlipidemia, unspecified: Secondary | ICD-10-CM

## 2022-04-02 DIAGNOSIS — E119 Type 2 diabetes mellitus without complications: Secondary | ICD-10-CM

## 2022-04-10 ENCOUNTER — Encounter: Payer: Self-pay | Admitting: Nurse Practitioner

## 2022-04-10 ENCOUNTER — Telehealth (INDEPENDENT_AMBULATORY_CARE_PROVIDER_SITE_OTHER): Payer: BC Managed Care – PPO | Admitting: Nurse Practitioner

## 2022-04-10 DIAGNOSIS — U071 COVID-19: Secondary | ICD-10-CM

## 2022-04-10 MED ORDER — NIRMATRELVIR/RITONAVIR (PAXLOVID)TABLET: 3.0000 | ORAL_TABLET | Freq: Two times a day (BID) | ORAL | 0 refills | Status: AC

## 2022-04-10 MED ORDER — AZELASTINE HCL 0.1 % NA SOLN: 1.0000 | Freq: Two times a day (BID) | NASAL | 0 refills | Status: DC

## 2022-04-10 NOTE — Patient Instructions (Signed)
Hold atorvastatin while taking paxlovid Maintain adequate oral hydration Use azelastine and saline nasal spray for sinus congestion. Schedule appt if no improvement in 1week.

## 2022-04-10 NOTE — Progress Notes (Signed)
Virtual Visit via Video Note  I connected withNAME@ on 04/10/22 at 10:20 AM EST by a video enabled telemedicine application and verified that I am speaking with the correct person using two identifiers.  Location: Patient:Home Provider: Office Participants: patient and provider  I discussed the limitations of evaluation and management by telemedicine and the availability of in person appointments. I also discussed with the patient that there may be a patient responsible charge related to this service. The patient expressed understanding and agreed to proceed.  CC: sinus congestion and positive home COVID test  History of Present Illness:  URI  This is a new problem. The current episode started in the past 7 days. The problem has been unchanged. The maximum temperature recorded prior to her arrival was 100.4 - 100.9 F. The fever has been present for 1 to 2 days. Associated symptoms include congestion, ear pain, headaches, joint pain, rhinorrhea and sinus pain. Pertinent negatives include no chest pain, coughing, diarrhea, dysuria, joint swelling, nausea, neck pain, plugged ear sensation, rash, sneezing, sore throat, swollen glands, vomiting or wheezing. She has tried decongestant for the symptoms. The treatment provided mild relief.    Observations/Objective: Physical Exam Vitals reviewed.  Constitutional:      General: She is not in acute distress. Pulmonary:     Effort: Pulmonary effort is normal.  Neurological:     Mental Status: She is alert and oriented to person, place, and time.     Assessment and Plan: Jaime Gilmore was seen today for acute visit.  Diagnoses and all orders for this visit:  COVID-19 -     azelastine (ASTELIN) 0.1 % nasal spray; Place 1 spray into both nostrils 2 (two) times daily. Use in each nostril as directed -     nirmatrelvir/ritonavir (PAXLOVID) 20 x 150 MG & 10 x 100MG  TABS; Take 3 tablets by mouth 2 (two) times daily for 5 days. (Take nirmatrelvir 150 mg two  tablets twice daily for 5 days and ritonavir 100 mg one tablet twice daily for 5 days) Patient GFR is 116   Follow Up Instructions: Hold atorvastatin while taking paxlovid Maintain adequate oral hydration Use azelastine and saline nasal spray for sinus congestion. Schedule appt if no improvement in 1week.   I discussed the assessment and treatment plan with the patient. The patient was provided an opportunity to ask questions and all were answered. The patient agreed with the plan and demonstrated an understanding of the instructions.   The patient was advised to call back or seek an in-person evaluation if the symptoms worsen or if the condition fails to improve as anticipated.  , NP

## 2022-04-10 NOTE — Telephone Encounter (Signed)
[facility] Night - Client TELEPHONE ADVICE RECORD Access[employer]urse Patient Name: Jaime Gilmore Gender: Female DOB: 05-Nov-1975 Age: 46 Y 8 M 21 D Return Phone Number: [phone] (Primary) Address: City/ State/ Zip: [place]  [address] Client [facility] Night - Client Client Site  [facility] - Night Provider Vernona Rieger - NP Contact Type Call Who Is Calling Patient / Member / Family / Caregiver Call Type Triage / Clinical Relationship To Patient Self Return Phone Number [phone] (Primary) Chief Complaint Earache Reason for Call Symptomatic / Request for Health Information Initial Comment Caller states she is having fever, stuffy nose, sinus drainage, ear pain. Translation No Nurse Assessment Nurse: Carylon Perches, RN, Hilda Lias Date/Time Lamount Cohen Time): 04/10/2022 7:42:00 AM Confirm and document reason for call. If symptomatic, describe symptoms. ---Caller states she is having a temp of 99, stuffy nose, sinus pressure and drainage, ear pain. Does the patient have any new or worsening symptoms? ---Yes Will a triage be completed? ---Yes Related visit to physician within the last 2 weeks? ---No Does the PT have any chronic conditions? (i.e. diabetes, asthma, this includes High risk factors for pregnancy, etc.) ---Yes List chronic conditions. ---diabetes Is the patient pregnant or possibly pregnant? (Ask all females between the ages of 98-55) ---No Is this a behavioral health or substance abuse call? ---No Guidelines Guideline Title Affirmed Question Affirmed Notes Nurse Date/Time Lamount Cohen Time) Sinus Pain or Congestion Heron Nay, RN, Hilda Lias 04/10/2022 7:43:06 AM Disp. Time Lamount Cohen Time) Disposition Final User 04/10/2022 7:45:01 AM See PCP within 24 Hours Yes Carylon Perches RN, Hilda Lias Final Disposition 04/10/2022 7:45:01 AM See PCP within 24 Hours Yes Carylon Perches, RN, Saul Fordyce NOTE: All timestamps  contained within this report are represented as [place] Standard Time. CONFIDENTIALTY NOTICE: This fax transmission is intended only for the addressee. It contains information that is legally privileged, confidential or otherwise protected from use or disclosure. If you are not the intended recipient, you are strictly prohibited from reviewing, disclosing, copying using or disseminating any of this information or taking any action in reliance on or regarding this information. If you have received this fax in error, please notify us immediately by telephone so that we can arrange for its return to [place]. Phone: [phone], Toll-Free: [phone], Fax: [phone] Page: 2 of 2 Call Id: [id number] Caller Disagree/Comply Comply Caller Understands Yes PreDisposition Did not know what to do Care Advice Given Per Guideline SEE PCP WITHIN 24 HOURS: * IF OFFICE WILL BE OPEN: You need to be examined within the next 24 hours. Call your doctor (or NP/PA) when the office opens and make an appointment. CARE ADVICE given per Sinus Pain or Congestion (Adult) guideline. CALL BACK IF: * Difficulty breathing (and not relieved by cleaning out nose) * You become worse Referrals REFERRED TO PCP OFFIC

## 2022-04-10 NOTE — Telephone Encounter (Signed)
Per appt notes pt has VV appt with Alysia Penna NP 04/10/22 at 10:20. Sending note to Alysia Penna NP.

## 2022-04-17 ENCOUNTER — Ambulatory Visit (INDEPENDENT_AMBULATORY_CARE_PROVIDER_SITE_OTHER): Payer: BC Managed Care – PPO | Admitting: Primary Care

## 2022-04-17 ENCOUNTER — Ambulatory Visit: Payer: BC Managed Care – PPO | Admitting: Primary Care

## 2022-04-17 ENCOUNTER — Encounter: Payer: Self-pay | Admitting: Primary Care

## 2022-04-17 VITALS — BP 122/80 | HR 73 | Temp 97.3°F | Ht 61.0 in | Wt 220.0 lb

## 2022-04-17 DIAGNOSIS — E1169 Type 2 diabetes mellitus with other specified complication: Secondary | ICD-10-CM | POA: Diagnosis not present

## 2022-04-17 DIAGNOSIS — E119 Type 2 diabetes mellitus without complications: Secondary | ICD-10-CM

## 2022-04-17 DIAGNOSIS — M5441 Lumbago with sciatica, right side: Secondary | ICD-10-CM

## 2022-04-17 DIAGNOSIS — G8929 Other chronic pain: Secondary | ICD-10-CM

## 2022-04-17 DIAGNOSIS — M5442 Lumbago with sciatica, left side: Secondary | ICD-10-CM

## 2022-04-17 LAB — POCT GLYCOSYLATED HEMOGLOBIN (HGB A1C): Hemoglobin A1C: 5.4 % (ref 4.0–5.6)

## 2022-04-17 MED ORDER — GABAPENTIN 100 MG PO CAPS: 200.0000 mg | ORAL_CAPSULE | Freq: Every day | ORAL | 1 refills | Status: DC

## 2022-04-17 MED ORDER — METFORMIN HCL ER 500 MG PO TB24: 500.0000 mg | ORAL_TABLET | Freq: Every day | ORAL | 1 refills | Status: DC

## 2022-04-17 NOTE — Progress Notes (Signed)
Subjective:    Patient ID: Jaime Gilmore, female    DOB: [birth year], 46 y.o.   MRN: [id number]  HPI  Jaime Gilmore is a very pleasant 46 y.o. female with a history of type 2 diabetes, morbid obesity s/p bariatric surgery,hyperlipidemia, insomnia, GAD who presents today for follow up of diabetes.  She is also needing a refill of her gabapentin for chronic back pain.  Current medications include: metformin XR 1000 mg daily.  Last A1C: 5.4 in October 2023 Last Eye Exam: Due Last Foot Exam: UTD Pneumonia Vaccination: Urine Microalbumin: UTD Statin: atorvastatin   Dietary changes since last visit: Reduced portion sizes. Mostly eats chicken tenders, hamburger patty, ham, chicken, some salad. Fast food three times weekly on average.    Exercise: No. She recently purchased a treadmill and recumbent bike, plans on exercising in the new year.   BP Readings from Last 3 Encounters:  04/17/22 122/80  01/30/22 122/74  10/30/21 124/76   Wt Readings from Last 3 Encounters:  04/17/22 220 lb (99.8 kg)  01/30/22 236 lb (107 kg)  10/30/21 277 lb (125.6 kg)       Review of Systems  Respiratory:  Negative for shortness of breath.   Cardiovascular:  Negative for chest pain.  Neurological:  Negative for dizziness and numbness.         Past Medical History:  Diagnosis Date   Anxiety    BRCA gene mutation negative in female 06/2015   Cervical high risk HPV (human papillomavirus) test positive 05/15/09;05/22/10;12/16/10;2014   hpv pos   COVID-19 virus infection 02/05/2020   Dysmenorrhea    Family history of breast cancer 06/2015   History of mammogram 02/19/2015   BIRAD I   History of Papanicolaou smear of cervix 06/25/2015   RNIL;NEG   Hyperlipidemia    Increased risk of breast cancer 08/2015   IBIS=24%   Insomnia    Onychomycosis 06/12/2014   Pap smear abnormality of cervix with ASCUS favoring dysplasia 05/22/10;12/16/10;11/27/11;05/30/12   Pap smear abnormality of cervix with  LGSIL 05/15/2009   Polycystic ovaries    Type 2 diabetes mellitus (HCC)    Vitamin D deficiency     Social History   Socioeconomic History   Marital status: Married    Spouse name: Not on file   Number of children: 0   Years of education: 14   Highest education level: Not on file  Occupational History   Occupation: business  Tobacco Use   Smoking status: Every Day    Packs/day: 1.00    Types: Cigarettes   Smokeless tobacco: Never  Vaping Use   Vaping Use: Never used  Substance and Sexual Activity   Alcohol use: No   Drug use: No   Sexual activity: Not Currently    Birth control/protection: I.U.D.    Comment: Mirena  Other Topics Concern   Not on file  Social History Narrative   Married.   No children.    Works in [place].   Enjoys watching movies, swimming.    Social Determinants of Health   Financial Resource Strain: Not on file  Food Insecurity: Not on file  Transportation Needs: Not on file  Physical Activity: Not on file  Stress: Not on file  Social Connections: Not on file  Intimate Partner Violence: Not on file    Past Surgical History:  Procedure Laterality Date   CERVICAL BIOPSY  W/ LOOP ELECTRODE EXCISION  06/2012   PATH NEG   COLPOSCOPY  05/15/09;2/17;12/3; 5/13  NO LESIONS   GASTRIC BYPASS  11/21/2021   INTRAUTERINE DEVICE (IUD) INSERTION  08/2007   IUD REMOVAL  05/30/2012   LAPAROSCOPIC CHOLECYSTECTOMY  2004   SKIN LESION EXCISION  2007   DERMATOFIBROMA    Family History  Problem Relation Age of Onset   Breast cancer Maternal Aunt 66       no contact   Breast cancer Mother 75   Anemia Mother    High Cholesterol Mother    Hypertension Father    Cancer Maternal Uncle        pancreatic   Diabetes Maternal Grandmother    Hypertension Paternal Grandmother    Hearing loss Paternal Grandmother    Heart disease Paternal Grandmother    High Cholesterol Paternal Grandmother    Cancer Cousin        ovarian- cured, no contact   Hearing loss  Paternal Grandfather    Early death Paternal Grandfather     No Known Allergies  Current Outpatient Medications on File Prior to Visit  Medication Sig Dispense Refill   ALPRAZolam (XANAX) 0.5 MG tablet Take 1 tablet (0.5 mg total) by mouth daily as needed for anxiety. Use sparingly. 10 tablet 0   atorvastatin (LIPITOR) 10 MG tablet TAKE 1 TABLET BY MOUTH EVERY DAY FOR CHOLESTEROL 90 tablet 0   azelastine (ASTELIN) 0.1 % nasal spray Place 1 spray into both nostrils 2 (two) times daily. Use in each nostril as directed 30 mL 0   busPIRone (BUSPAR) 15 MG tablet Take 1 tablet (15 mg total) by mouth 3 (three) times daily. For anxiety. 270 tablet 1   escitalopram (LEXAPRO) 20 MG tablet TAKE 1 TABLET (20 MG TOTAL) BY MOUTH DAILY. FOR ANXIETY. 90 tablet 0   traZODone (DESYREL) 100 MG tablet TAKE 1 TABLET (100 MG TOTAL) BY MOUTH AT BEDTIME. FOR SLEEP. 90 tablet 0   influenza vac split quadrivalent PF (FLUARIX QUADRIVALENT) 0.5 ML injection Inject into the muscle. (Patient not taking: Reported on 01/30/2022) 0.5 mL 0   levonorgestrel (MIRENA) 20 MCG/DAY IUD 1 each by Intrauterine route once for 1 dose. 1 each 0   No current facility-administered medications on file prior to visit.    BP 122/80   Pulse 73   Temp (!) 97.3 F (36.3 C) (Temporal)   Ht _0  (1.549 m)   Wt 220 lb (99.8 kg)   SpO2 98%   BMI 41.57 kg/m  Objective:   Physical Exam Cardiovascular:     Rate and Rhythm: Normal rate and regular rhythm.  Pulmonary:     Effort: Pulmonary effort is normal.     Breath sounds: Normal breath sounds.  Musculoskeletal:     Cervical back: Neck supple.  Skin:    General: Skin is warm and dry.           Assessment & Plan:   Problem List Items Addressed This Visit       Endocrine   Type 2 diabetes mellitus ([place]) - Primary    Controlled with A1C of 5.4!  Reduce metformin XR to 500 mg daily.   Encouraged her to start regular exercise. Continue to work on weight loss!  Follow  up in 6 months.      Relevant Medications   metFORMIN (GLUCOPHAGE-XR) 500 MG 24 hr tablet   Other Relevant Orders   POCT glycosylated hemoglobin (Hb A1C) (Completed)     Other   Chronic back pain    Waxes and wanes.  Continue gabapentin 200 mg  HS. Refill provided today.      Relevant Medications   gabapentin (NEURONTIN) 100 MG capsule       Pleas Koch, NP

## 2022-04-17 NOTE — Assessment & Plan Note (Signed)
Controlled with A1C of 5.4!  Reduce metformin XR to 500 mg daily.   Encouraged her to start regular exercise. Continue to work on weight loss!  Follow up in 6 months.

## 2022-04-17 NOTE — Assessment & Plan Note (Signed)
Waxes and wanes.  Continue gabapentin 200 mg HS. Refill provided today.

## 2022-05-02 ENCOUNTER — Other Ambulatory Visit: Payer: Self-pay | Admitting: Nurse Practitioner

## 2022-05-02 DIAGNOSIS — U071 COVID-19: Secondary | ICD-10-CM

## 2022-05-05 ENCOUNTER — Ambulatory Visit: Payer: BC Managed Care – PPO | Admitting: Primary Care

## 2022-05-10 ENCOUNTER — Other Ambulatory Visit: Payer: Self-pay | Admitting: Primary Care

## 2022-05-10 DIAGNOSIS — F411 Generalized anxiety disorder: Secondary | ICD-10-CM

## 2022-05-16 ENCOUNTER — Other Ambulatory Visit: Payer: Self-pay | Admitting: Nurse Practitioner

## 2022-05-16 DIAGNOSIS — U071 COVID-19: Secondary | ICD-10-CM

## 2022-05-20 ENCOUNTER — Other Ambulatory Visit: Payer: Self-pay | Admitting: Primary Care

## 2022-05-20 DIAGNOSIS — F411 Generalized anxiety disorder: Secondary | ICD-10-CM

## 2022-05-21 ENCOUNTER — Other Ambulatory Visit: Payer: Self-pay | Admitting: Primary Care

## 2022-05-21 DIAGNOSIS — F411 Generalized anxiety disorder: Secondary | ICD-10-CM

## 2022-05-21 MED ORDER — BUSPIRONE HCL 15 MG PO TABS: 15.0000 mg | ORAL_TABLET | Freq: Three times a day (TID) | ORAL | 1 refills | Status: DC

## 2022-05-21 NOTE — Telephone Encounter (Signed)
Noted, refills sent to pharmacy. 

## 2022-05-21 NOTE — Telephone Encounter (Signed)
Please call patient:  Received refill request from pharmacy for buspirone 10 mg tabs. I have her on 15 mg tabs now. Does she need a refill of the 15 mg?

## 2022-05-21 NOTE — Telephone Encounter (Signed)
Called and spoke to patient, she is taking the 15mg  tab and she has run out. She needs a prescription sent to [facility] on [address].

## 2022-05-25 DIAGNOSIS — Z713 Dietary counseling and surveillance: Secondary | ICD-10-CM | POA: Diagnosis not present

## 2022-05-25 DIAGNOSIS — K912 Postsurgical malabsorption, not elsewhere classified: Secondary | ICD-10-CM | POA: Diagnosis not present

## 2022-05-25 DIAGNOSIS — Z48815 Encounter for surgical aftercare following surgery on the digestive system: Secondary | ICD-10-CM | POA: Diagnosis not present

## 2022-05-25 DIAGNOSIS — Z9884 Bariatric surgery status: Secondary | ICD-10-CM | POA: Diagnosis not present

## 2022-05-25 DIAGNOSIS — Z6841 Body Mass Index (BMI) 40.0 and over, adult: Secondary | ICD-10-CM | POA: Diagnosis not present

## 2022-06-16 ENCOUNTER — Other Ambulatory Visit: Payer: Self-pay | Admitting: Primary Care

## 2022-06-16 DIAGNOSIS — G4700 Insomnia, unspecified: Secondary | ICD-10-CM

## 2022-06-29 ENCOUNTER — Other Ambulatory Visit: Payer: Self-pay | Admitting: Primary Care

## 2022-06-29 DIAGNOSIS — E119 Type 2 diabetes mellitus without complications: Secondary | ICD-10-CM

## 2022-06-29 DIAGNOSIS — E1169 Type 2 diabetes mellitus with other specified complication: Secondary | ICD-10-CM

## 2022-06-29 DIAGNOSIS — F411 Generalized anxiety disorder: Secondary | ICD-10-CM

## 2022-07-28 ENCOUNTER — Other Ambulatory Visit: Payer: Self-pay | Admitting: Primary Care

## 2022-07-28 DIAGNOSIS — F411 Generalized anxiety disorder: Secondary | ICD-10-CM

## 2022-07-28 MED ORDER — ALPRAZOLAM 0.5 MG PO TABS: 0.5000 mg | ORAL_TABLET | Freq: Every day | ORAL | 0 refills | Status: DC | PRN

## 2022-07-28 NOTE — Telephone Encounter (Signed)
From: Tressie Ellis To: Office of Doreene Nest, NP Sent: 07/28/2022 3:50 PM EDT Subject: Medication Renewal Request  Refills have been requested for the following medications:   ALPRAZolam Prudy Feeler) 0.5 MG tablet [[place] [facility] Axel Meas]  Patient Comment: Can I get kore than 10 please  Preferred pharmacy: [id number] [facility], [place] - [address] Delivery method: [employer]

## 2022-08-04 NOTE — Telephone Encounter (Deleted)
Hi Debraann,   We are happy to see you for an appointment for your concerns. To schedule, please call our office at [phone].   Thanks,  Lonia Blood, CMA

## 2022-08-07 ENCOUNTER — Encounter: Payer: Self-pay | Admitting: Primary Care

## 2022-08-07 ENCOUNTER — Ambulatory Visit: Payer: BC Managed Care – PPO | Admitting: Primary Care

## 2022-08-07 VITALS — BP 100/64 | HR 75 | Temp 98.1°F | Ht 61.0 in | Wt 232.0 lb

## 2022-08-07 DIAGNOSIS — M545 Low back pain, unspecified: Secondary | ICD-10-CM

## 2022-08-07 DIAGNOSIS — G8929 Other chronic pain: Secondary | ICD-10-CM

## 2022-08-07 DIAGNOSIS — E559 Vitamin D deficiency, unspecified: Secondary | ICD-10-CM | POA: Diagnosis not present

## 2022-08-07 DIAGNOSIS — R7989 Other specified abnormal findings of blood chemistry: Secondary | ICD-10-CM

## 2022-08-07 LAB — HEPATIC FUNCTION PANEL
ALT: 69 U/L — ABNORMAL HIGH (ref 0–35)
AST: 42 U/L — ABNORMAL HIGH (ref 0–37)
Albumin: 3.9 g/dL (ref 3.5–5.2)
Alkaline Phosphatase: 88 U/L (ref 39–117)
Bilirubin, Direct: 0 mg/dL (ref 0.0–0.3)
Total Bilirubin: 0.2 mg/dL (ref 0.2–1.2)
Total Protein: 6.6 g/dL (ref 6.0–8.3)

## 2022-08-07 LAB — VITAMIN D 25 HYDROXY (VIT D DEFICIENCY, FRACTURES): VITD: 22.62 ng/mL — ABNORMAL LOW (ref 30.00–100.00)

## 2022-08-07 NOTE — Progress Notes (Signed)
Subjective:    Patient ID: Jaime Gilmore, female    DOB: July 30, 1975, 47 y.o.   MRN: [id number]  Back Pain Pertinent negatives include no dysuria or numbness.    Jaime Gilmore is a very pleasant 47 y.o. female with a history of type 2 diabetes, PCOS, GAD, hyperlipidemia, chronic back pain, morbid obesity who presents today to discuss back pain. She is also needing repeat vitamin D and liver enzymes repeated.   1) Back Pain: Acute on chronic. Symptom onset four days ago to the right lower back. Her pain is a constant dull pain, originally pain was sharp, but her pain has improved. Chronic history of lower back pain, has not taken gabapentin recently.   She's been drinking a lot of sweet tea recently so she thought maybe she had a UTI.   She denies radiation of back pain, dysuria, hematuria, abdominal pain, numbness/tingling.   Wt Readings from Last 3 Encounters:  08/07/22 232 lb (105.2 kg)  04/17/22 220 lb (99.8 kg)  01/30/22 236 lb (107 kg)   2) Vitamin D Deficiency: Currently taking Vitamin D3 10,000 IU daily. Previously taking 5000 IU daily until her vitamin D level in early February 2024 was 19. She is due for repeat levels today.   3) Elevated LFT's: Evaluated by her bariatric surgeon in February 2024, labs collected at this visit, liver enzymes returned elevated. AST - 64, ALT - 126, Alk Phos - 124. She is due for repeat liver enzymes today.   She denies recurrent use of Tylenol as it is ineffective for her back pain. She is not managed on statin therapy.      Review of Systems  Genitourinary:  Negative for dysuria, flank pain, frequency and hematuria.  Musculoskeletal:  Positive for back pain.  Neurological:  Negative for numbness.         Past Medical History:  Diagnosis Date   Anxiety    BRCA gene mutation negative in female 06/2015   Cervical high risk HPV (human papillomavirus) test positive 05/15/09;05/22/10;12/16/10;2014   hpv pos   COVID-19 virus infection  02/05/2020   Dysmenorrhea    Family history of breast cancer 06/2015   History of mammogram 02/19/2015   BIRAD I   History of Papanicolaou smear of cervix 06/25/2015   RNIL;NEG   Hyperlipidemia    Increased risk of breast cancer 08/2015   IBIS=24%   Insomnia    Onychomycosis 06/12/2014   Pap smear abnormality of cervix with ASCUS favoring dysplasia 05/22/10;12/16/10;11/27/11;05/30/12   Pap smear abnormality of cervix with LGSIL 05/15/2009   Polycystic ovaries    Type 2 diabetes mellitus    Vitamin D deficiency     Social History   Socioeconomic History   Marital status: Married    Spouse name: Not on file   Number of children: 0   Years of education: 14   Highest education level: Not on file  Occupational History   Occupation: business  Tobacco Use   Smoking status: Every Day    Packs/day: 1    Types: Cigarettes   Smokeless tobacco: Never  Vaping Use   Vaping Use: Never used  Substance and Sexual Activity   Alcohol use: No   Drug use: No   Sexual activity: Not Currently    Birth control/protection: I.U.D.    Comment: Mirena  Other Topics Concern   Not on file  Social History Narrative   Married.   No children.    Works in [employer].  Enjoys watching movies, swimming.    Social Determinants of Health   Financial Resource Strain: Not on file  Food Insecurity: Not on file  Transportation Needs: Not on file  Physical Activity: Not on file  Stress: Not on file  Social Connections: Not on file  Intimate Partner Violence: Not on file    Past Surgical History:  Procedure Laterality Date   CERVICAL BIOPSY  W/ LOOP ELECTRODE EXCISION  06/2012   PATH NEG   COLPOSCOPY  05/15/09;2/17;12/3; 5/13   NO LESIONS   GASTRIC BYPASS  11/21/2021   INTRAUTERINE DEVICE (IUD) INSERTION  08/2007   IUD REMOVAL  05/30/2012   LAPAROSCOPIC CHOLECYSTECTOMY  2004   SKIN LESION EXCISION  2007   DERMATOFIBROMA    Family History  Problem Relation Age of Onset   Breast cancer Maternal Aunt  40       no contact   Breast cancer Mother 30   Anemia Mother    High Cholesterol Mother    Hypertension Father    Cancer Maternal Uncle        pancreatic   Diabetes Maternal Grandmother    Hypertension Paternal Grandmother    Hearing loss Paternal Grandmother    Heart disease Paternal Grandmother    High Cholesterol Paternal Grandmother    Cancer Cousin        ovarian- cured, no contact   Hearing loss Paternal Grandfather    Early death Paternal Grandfather     No Known Allergies  Current Outpatient Medications on File Prior to Visit  Medication Sig Dispense Refill   ALPRAZolam (XANAX) 0.5 MG tablet Take 1 tablet (0.5 mg total) by mouth daily as needed for anxiety. Use sparingly. 10 tablet 0   atorvastatin (LIPITOR) 10 MG tablet TAKE 1 TABLET BY MOUTH EVERY DAY FOR CHOLESTEROL 90 tablet 0   busPIRone (BUSPAR) 15 MG tablet Take 1 tablet (15 mg total) by mouth 3 (three) times daily. For anxiety. 270 tablet 1   escitalopram (LEXAPRO) 20 MG tablet TAKE 1 TABLET (20 MG TOTAL) BY MOUTH DAILY. FOR ANXIETY. 90 tablet 1   gabapentin (NEURONTIN) 100 MG capsule Take 2 capsules (200 mg total) by mouth at bedtime. For pain 180 capsule 1   metFORMIN (GLUCOPHAGE-XR) 500 MG 24 hr tablet Take 1 tablet (500 mg total) by mouth daily with breakfast. for diabetes. 90 tablet 1   traZODone (DESYREL) 100 MG tablet TAKE 1 TABLET (100 MG TOTAL) BY MOUTH AT BEDTIME. FOR SLEEP. 90 tablet 1   Azelastine HCl 137 MCG/SPRAY SOLN PLACE 1 SPRAY INTO BOTH NOSTRILS 2 (TWO) TIMES DAILY. USE IN EACH NOSTRIL AS DIRECTED 30 mL 2   influenza vac split quadrivalent PF (FLUARIX QUADRIVALENT) 0.5 ML injection Inject into the muscle. (Patient not taking: Reported on 01/30/2022) 0.5 mL 0   levonorgestrel (MIRENA) 20 MCG/DAY IUD 1 each by Intrauterine route once for 1 dose. 1 each 0   No current facility-administered medications on file prior to visit.    BP 100/64   Pulse 75   Temp 98.1 F (36.7 C) (Temporal)   Ht   (1.549 m)   Wt 232 lb (105.2 kg)   SpO2 97%   BMI 43.84 kg/m  Objective:   Physical Exam Constitutional:      General: She is not in acute distress. Cardiovascular:     Rate and Rhythm: Normal rate and regular rhythm.  Pulmonary:     Effort: Pulmonary effort is normal.  Musculoskeletal:     Lumbar  back: No bony tenderness. Normal range of motion.       Back:     Comments: Mild pain with twisting motion to bilateral sides.   Neurological:     Mental Status: She is alert.  Psychiatric:        Mood and Affect: Mood normal.           Assessment & Plan:  Elevated LFTs Assessment & Plan: Reviewed LFT's from Care Everywhere from February 2024 Repeat liver enzymes pending.  Orders: -     Hepatic function panel  Vitamin D deficiency Assessment & Plan: Reviewed vitamin D level from February 2024 through Care Everywhere.  Continue vitamin D3 10,000 IU daily.   Repeat vitamin D level pending.  Orders: -     VITAMIN D 25 Hydroxy (Vit-D Deficiency, Fractures)  Chronic bilateral low back pain without sciatica Assessment & Plan: Acute on chronic flare, improved.   No alarm signs.  Recommended she resume gabapentin 200 mg HS. Avoid recurrent NSAID use.  Follow up PRN.         Doreene Nest, NP

## 2022-08-07 NOTE — Assessment & Plan Note (Signed)
Acute on chronic flare, improved.   No alarm signs.  Recommended she resume gabapentin 200 mg HS. Avoid recurrent NSAID use.  Follow up PRN.

## 2022-08-07 NOTE — Assessment & Plan Note (Signed)
Reviewed LFT's from Care Everywhere from February 2024 Repeat liver enzymes pending.

## 2022-08-07 NOTE — Assessment & Plan Note (Signed)
Reviewed vitamin D level from February 2024 through Care Everywhere.  Continue vitamin D3 10,000 IU daily.   Repeat vitamin D level pending.

## 2022-08-14 ENCOUNTER — Other Ambulatory Visit: Payer: Self-pay | Admitting: Primary Care

## 2022-08-14 DIAGNOSIS — U071 COVID-19: Secondary | ICD-10-CM

## 2022-09-26 ENCOUNTER — Other Ambulatory Visit: Payer: Self-pay | Admitting: Primary Care

## 2022-09-26 DIAGNOSIS — E119 Type 2 diabetes mellitus without complications: Secondary | ICD-10-CM

## 2022-09-26 DIAGNOSIS — E1169 Type 2 diabetes mellitus with other specified complication: Secondary | ICD-10-CM

## 2022-10-07 ENCOUNTER — Other Ambulatory Visit: Payer: Self-pay | Admitting: Primary Care

## 2022-10-07 ENCOUNTER — Telehealth: Payer: Self-pay | Admitting: Primary Care

## 2022-10-07 DIAGNOSIS — Z1231 Encounter for screening mammogram for malignant neoplasm of breast: Secondary | ICD-10-CM

## 2022-10-07 DIAGNOSIS — E119 Type 2 diabetes mellitus without complications: Secondary | ICD-10-CM

## 2022-10-07 NOTE — Telephone Encounter (Signed)
Please notify patient that I placed an order for her to have her mammogram completed at the [facility] breast center in [place].  She will just have to call them to schedule.

## 2022-10-07 NOTE — Telephone Encounter (Signed)
Patient scheduled.

## 2022-10-07 NOTE — Telephone Encounter (Signed)
Called patient and reviewed all information. Patient verbalized understanding. Will call if any further questions.  

## 2022-10-07 NOTE — Telephone Encounter (Signed)
Patient would like to have her mammogram. She would like to know if an order can be placed for her to receive one.

## 2022-10-07 NOTE — Telephone Encounter (Signed)
Patient is due for CPE/follow up in August, this will be required prior to any further refills.  Please schedule, thank you!   

## 2022-11-04 ENCOUNTER — Other Ambulatory Visit: Payer: Self-pay | Admitting: Primary Care

## 2022-11-04 DIAGNOSIS — F411 Generalized anxiety disorder: Secondary | ICD-10-CM

## 2022-11-09 ENCOUNTER — Other Ambulatory Visit: Payer: Self-pay | Admitting: Primary Care

## 2022-11-09 DIAGNOSIS — F411 Generalized anxiety disorder: Secondary | ICD-10-CM

## 2022-11-09 MED ORDER — ALPRAZOLAM 0.5 MG PO TABS
0.5000 mg | ORAL_TABLET | Freq: Every day | ORAL | 0 refills | Status: DC | PRN
Start: 2022-11-09 — End: 2023-01-28

## 2022-11-13 ENCOUNTER — Ambulatory Visit
Admission: RE | Admit: 2022-11-13 | Discharge: 2022-11-13 | Disposition: A | Discharge status: Home / Self Care | Payer: PRIVATE HEALTH INSURANCE | Source: Ambulatory Visit | Attending: Primary Care | Admitting: Primary Care

## 2022-11-13 DIAGNOSIS — Z1231 Encounter for screening mammogram for malignant neoplasm of breast: Secondary | ICD-10-CM | POA: Insufficient documentation

## 2022-11-17 ENCOUNTER — Other Ambulatory Visit: Payer: Self-pay | Admitting: Primary Care

## 2022-11-17 DIAGNOSIS — R928 Other abnormal and inconclusive findings on diagnostic imaging of breast: Secondary | ICD-10-CM

## 2022-11-17 DIAGNOSIS — N6489 Other specified disorders of breast: Secondary | ICD-10-CM

## 2022-11-18 ENCOUNTER — Other Ambulatory Visit: Payer: Self-pay | Admitting: Primary Care

## 2022-11-18 ENCOUNTER — Ambulatory Visit
Admission: RE | Admit: 2022-11-18 | Discharge: 2022-11-18 | Disposition: A | Discharge status: Home / Self Care | Payer: PRIVATE HEALTH INSURANCE | Source: Ambulatory Visit | Attending: Primary Care | Admitting: Primary Care

## 2022-11-18 ENCOUNTER — Encounter (INDEPENDENT_AMBULATORY_CARE_PROVIDER_SITE_OTHER): Payer: Self-pay

## 2022-11-18 DIAGNOSIS — R928 Other abnormal and inconclusive findings on diagnostic imaging of breast: Secondary | ICD-10-CM

## 2022-11-18 DIAGNOSIS — N6489 Other specified disorders of breast: Secondary | ICD-10-CM | POA: Diagnosis present

## 2022-11-18 DIAGNOSIS — G8929 Other chronic pain: Secondary | ICD-10-CM

## 2022-12-03 ENCOUNTER — Ambulatory Visit (INDEPENDENT_AMBULATORY_CARE_PROVIDER_SITE_OTHER): Payer: PRIVATE HEALTH INSURANCE | Admitting: Primary Care

## 2022-12-03 ENCOUNTER — Encounter: Payer: Self-pay | Admitting: Primary Care

## 2022-12-03 ENCOUNTER — Other Ambulatory Visit: Payer: Self-pay | Admitting: Primary Care

## 2022-12-03 VITALS — BP 118/74 | HR 82 | Temp 97.1°F | Ht 61.0 in | Wt 234.0 lb

## 2022-12-03 DIAGNOSIS — F411 Generalized anxiety disorder: Secondary | ICD-10-CM

## 2022-12-03 DIAGNOSIS — K219 Gastro-esophageal reflux disease without esophagitis: Secondary | ICD-10-CM | POA: Diagnosis not present

## 2022-12-03 DIAGNOSIS — E1169 Type 2 diabetes mellitus with other specified complication: Secondary | ICD-10-CM

## 2022-12-03 DIAGNOSIS — Z0000 Encounter for general adult medical examination without abnormal findings: Secondary | ICD-10-CM

## 2022-12-03 DIAGNOSIS — Z0001 Encounter for general adult medical examination with abnormal findings: Secondary | ICD-10-CM | POA: Insufficient documentation

## 2022-12-03 DIAGNOSIS — M545 Low back pain, unspecified: Secondary | ICD-10-CM | POA: Diagnosis not present

## 2022-12-03 DIAGNOSIS — E785 Hyperlipidemia, unspecified: Secondary | ICD-10-CM | POA: Diagnosis not present

## 2022-12-03 DIAGNOSIS — G8929 Other chronic pain: Secondary | ICD-10-CM

## 2022-12-03 DIAGNOSIS — Z9189 Other specified personal risk factors, not elsewhere classified: Secondary | ICD-10-CM

## 2022-12-03 DIAGNOSIS — Z7984 Long term (current) use of oral hypoglycemic drugs: Secondary | ICD-10-CM

## 2022-12-03 DIAGNOSIS — Z1211 Encounter for screening for malignant neoplasm of colon: Secondary | ICD-10-CM

## 2022-12-03 DIAGNOSIS — G4700 Insomnia, unspecified: Secondary | ICD-10-CM

## 2022-12-03 LAB — BASIC METABOLIC PANEL
BUN: 10 mg/dL (ref 6–23)
CO2: 23 mEq/L (ref 19–32)
Calcium: 9.2 mg/dL (ref 8.4–10.5)
Chloride: 104 mEq/L (ref 96–112)
Creatinine, Ser: 0.55 mg/dL (ref 0.40–1.20)
GFR: 107.67 mL/min (ref >60.00)
Glucose, Bld: 104 mg/dL — ABNORMAL HIGH (ref 70–99)
Potassium: 3.9 mEq/L (ref 3.5–5.1)
Sodium: 134 mEq/L — ABNORMAL LOW (ref 135–145)

## 2022-12-03 LAB — HEMOGLOBIN A1C: Hgb A1c MFr Bld: 5.2 % (ref 4.6–6.5)

## 2022-12-03 LAB — LIPID PANEL
Cholesterol: 117 mg/dL (ref 0–200)
HDL: 47 mg/dL (ref >39.00)
LDL Cholesterol: 45 mg/dL (ref 0–99)
NonHDL: 70.01
Total CHOL/HDL Ratio: 2
Triglycerides: 123 mg/dL (ref 0.0–149.0)
VLDL: 24.6 mg/dL (ref 0.0–40.0)

## 2022-12-03 LAB — MICROALBUMIN / CREATININE URINE RATIO
Creatinine,U: 85 mg/dL
Microalb Creat Ratio: 1 mg/g (ref 0.0–30.0)
Microalb, Ur: 0.8 mg/dL (ref 0.0–1.9)

## 2022-12-03 MED ORDER — FAMOTIDINE 20 MG PO TABS: 20.0000 mg | ORAL_TABLET | Freq: Every day | ORAL | 3 refills | Status: DC

## 2022-12-03 NOTE — Patient Instructions (Signed)
Stop by the lab prior to leaving today. I will notify you of your results once received.   You will either be contacted via phone regarding your referral to GI for the colonoscopy, or you may receive a letter on your MyChart portal from our referral team with instructions for scheduling an appointment. Please let us know if you have not been contacted by anyone within two weeks.  Please schedule a follow up visit for 6 months for a diabetes check.  It was a pleasure to see you today!

## 2022-12-03 NOTE — Progress Notes (Signed)
Subjective:    Patient ID: Jaime Gilmore, female    DOB: 27-Apr-1975, 47 y.o.   MRN: [id number]  HPI  Jaime Gilmore is a very pleasant 47 y.o. female who presents today for complete physical and follow up of chronic conditions.   Immunizations: -Tetanus: Completed in 2022 -Pneumonia: Completed Pneumovax 23 in 2019  Diet: Fair diet.  Exercise: No regular exercise.  Eye exam: Completes annually  Dental exam: Completes semi-annually    Pap Smear: March 2022 Mammogram: July 2024  Colonoscopy: Never completed.       Review of Systems  Constitutional:  Negative for unexpected weight change.  HENT:  Negative for rhinorrhea.   Respiratory:  Negative for cough and shortness of breath.   Cardiovascular:  Negative for chest pain.  Gastrointestinal:  Negative for constipation and diarrhea.       GERD  Genitourinary:  Negative for difficulty urinating.  Musculoskeletal:  Positive for back pain. Negative for arthralgias.  Skin:  Negative for rash.  Allergic/Immunologic: Negative for environmental allergies.  Neurological:  Negative for dizziness, numbness and headaches.  Psychiatric/Behavioral:  The patient is nervous/anxious.          Past Medical History:  Diagnosis Date   Acute knee pain 06/06/2021   Anxiety    BRCA gene mutation negative in female 06/2015   Cervical high risk HPV (human papillomavirus) test positive 05/15/09;05/22/10;12/16/10;2014   hpv pos   COVID-19 virus infection 02/05/2020   Dysmenorrhea    Family history of breast cancer 06/2015   History of mammogram 02/19/2015   BIRAD I   History of Papanicolaou smear of cervix 06/25/2015   RNIL;NEG   Hyperlipidemia    Increased risk of breast cancer 08/2015   IBIS=24%   Insomnia    Onychomycosis 06/12/2014   Pap smear abnormality of cervix with ASCUS favoring dysplasia 05/22/10;12/16/10;11/27/11;05/30/12   Pap smear abnormality of cervix with LGSIL 05/15/2009   Polycystic ovaries    Type 2 diabetes  mellitus (HCC)    Vitamin D deficiency     Social History   Socioeconomic History   Marital status: Married    Spouse name: Not on file   Number of children: 0   Years of education: 14   Highest education level: Not on file  Occupational History   Occupation: business  Tobacco Use   Smoking status: Every Day    Current packs/day: 1.00    Types: Cigarettes   Smokeless tobacco: Never  Vaping Use   Vaping status: Never Used  Substance and Sexual Activity   Alcohol use: No   Drug use: No   Sexual activity: Not Currently    Birth control/protection: I.U.D.    Comment: Mirena  Other Topics Concern   Not on file  Social History Narrative   Married.   No children.    Works in HR.   Enjoys watching movies, swimming.    Social Determinants of Health   Financial Resource Strain: Not on file  Food Insecurity: Not on file  Transportation Needs: Not on file  Physical Activity: Not on file  Stress: Not on file  Social Connections: Not on file  Intimate Partner Violence: Not on file    Past Surgical History:  Procedure Laterality Date   CERVICAL BIOPSY  W/ LOOP ELECTRODE EXCISION  06/2012   PATH NEG   COLPOSCOPY  05/15/09;2/17;12/3; 5/13   NO LESIONS   GASTRIC BYPASS  11/21/2021   INTRAUTERINE DEVICE (IUD) INSERTION  08/2007   IUD REMOVAL  05/30/2012   LAPAROSCOPIC CHOLECYSTECTOMY  2004   SKIN LESION EXCISION  2007   DERMATOFIBROMA    Family History  Problem Relation Age of Onset   Breast cancer Maternal Aunt 40       no contact   Breast cancer Mother 68   Anemia Mother    High Cholesterol Mother    Hypertension Father    Cancer Maternal Uncle        pancreatic   Diabetes Maternal Grandmother    Hypertension Paternal Grandmother    Hearing loss Paternal Grandmother    Heart disease Paternal Grandmother    High Cholesterol Paternal Grandmother    Cancer Cousin        ovarian- cured, no contact   Hearing loss Paternal Grandfather    Early death Paternal  Grandfather     No Known Allergies  Current Outpatient Medications on File Prior to Visit  Medication Sig Dispense Refill   ALPRAZolam (XANAX) 0.5 MG tablet Take 1 tablet (0.5 mg total) by mouth daily as needed for anxiety. Use sparingly. 10 tablet 0   atorvastatin (LIPITOR) 10 MG tablet TAKE 1 TABLET BY MOUTH EVERY DAY FOR CHOLESTEROL 90 tablet 0   busPIRone (BUSPAR) 15 MG tablet TAKE 1 TABLET (15 MG TOTAL) BY MOUTH 3 (THREE) TIMES DAILY. FOR ANXIETY. 270 tablet 0   escitalopram (LEXAPRO) 20 MG tablet TAKE 1 TABLET (20 MG TOTAL) BY MOUTH DAILY. FOR ANXIETY. 90 tablet 1   gabapentin (NEURONTIN) 100 MG capsule TAKE 2 CAPSULES (200 MG TOTAL) BY MOUTH AT BEDTIME. FOR PAIN 180 capsule 0   levonorgestrel (MIRENA) 20 MCG/DAY IUD 1 each by Intrauterine route once for 1 dose. 1 each 0   metFORMIN (GLUCOPHAGE-XR) 500 MG 24 hr tablet Take 1 tablet (500 mg total) by mouth daily with breakfast. for diabetes. 90 tablet 0   traZODone (DESYREL) 100 MG tablet TAKE 1 TABLET (100 MG TOTAL) BY MOUTH AT BEDTIME. FOR SLEEP. 90 tablet 1   No current facility-administered medications on file prior to visit.    BP 118/74   Pulse 82   Temp (!) 97.1 F (36.2 C) (Temporal)   Ht 5\' 1"  (1.549 m)   Wt 234 lb (106.1 kg)   LMP 11/30/2022   SpO2 97%   BMI 44.21 kg/m  Objective:   Physical Exam HENT:     Right Ear: Tympanic membrane and ear canal normal.     Left Ear: Tympanic membrane and ear canal normal.     Nose: Nose normal.  Eyes:     Conjunctiva/sclera: Conjunctivae normal.     Pupils: Pupils are equal, round, and reactive to light.  Neck:     Thyroid: No thyromegaly.  Cardiovascular:     Rate and Rhythm: Normal rate and regular rhythm.     Heart sounds: No murmur heard. Pulmonary:     Effort: Pulmonary effort is normal.     Breath sounds: Normal breath sounds. No rales.  Abdominal:     General: Bowel sounds are normal.     Palpations: Abdomen is soft.     Tenderness: There is no abdominal  tenderness.  Musculoskeletal:        General: Normal range of motion.     Cervical back: Neck supple.  Lymphadenopathy:     Cervical: No cervical adenopathy.  Skin:    General: Skin is warm and dry.     Findings: No rash.  Neurological:     Mental Status: She is alert and oriented  to person, place, and time.     Cranial Nerves: No cranial nerve deficit.     Deep Tendon Reflexes: Reflexes are normal and symmetric.  Psychiatric:        Mood and Affect: Mood normal.           Assessment & Plan:  Preventative health care Assessment & Plan: Immunizations UTD. Pap smear UTD. Mammogram up-to-date Colonoscopy due, referral placed to GI  Discussed the importance of a healthy diet and regular exercise in order for weight loss, and to reduce the risk of further co-morbidity.  Exam stable. Labs pending.  Follow up in 1 year for repeat physical.    Gastroesophageal reflux disease, unspecified whether esophagitis present Assessment & Plan: Overall controlled except for certain trigger foods.  Resume famotidine 20 mg daily.  Rx sent to pharmacy.  Orders: -     Famotidine; Take 1 tablet (20 mg total) by mouth daily. For heartburn  Dispense: 90 tablet; Refill: 3  Hyperlipidemia associated with type 2 diabetes mellitus (HCC) Assessment & Plan: Repeat lipid panel pending.  Discussed the importance of a healthy diet and regular exercise in order for weight loss, and to reduce the risk of further co-morbidity. Continue atorvastatin 10 mg daily.  Orders: -     Lipid panel  Type 2 diabetes mellitus with other specified complication, without long-term current use of insulin (HCC) Assessment & Plan: Repeat A1C pending.  Continue metformin XR 500 mg daily. Foot exam today.  Orders: -     Microalbumin / creatinine urine ratio -     Hemoglobin A1c -     Basic metabolic panel  Chronic bilateral low back pain without sciatica Assessment & Plan: Controlled.  Continue  gabapentin 200 mg HS.    GAD (generalized anxiety disorder) Assessment & Plan: Controlled.  Continue Lexapro 20 mg daily, Buspar 15 mg BID, alprazolam 0.5 mg PRN.   Increased risk of breast cancer Assessment & Plan: Mammogram UTD.   Insomnia, unspecified type Assessment & Plan: Deteriorated recently which could be because she's napping around 7 pm nightly due to drowsiness.  Move Lexapro to evening as it may be causing daytime tiredness.  Continue Trazodone 100 mg HS.   Screening for colon cancer -     Ambulatory referral to Gastroenterology        Doreene Nest, NP

## 2022-12-03 NOTE — Assessment & Plan Note (Signed)
Repeat A1C pending.  Continue metformin XR 500 mg daily. Foot exam today.

## 2022-12-03 NOTE — Assessment & Plan Note (Signed)
Deteriorated recently which could be because she's napping around 7 pm nightly due to drowsiness.  Move Lexapro to evening as it may be causing daytime tiredness.  Continue Trazodone 100 mg HS.

## 2022-12-03 NOTE — Assessment & Plan Note (Signed)
 Immunizations UTD. Pap smear UTD. Mammogram up-to-date Colonoscopy due, referral placed to GI  Discussed the importance of a healthy diet and regular exercise in order for weight loss, and to reduce the risk of further co-morbidity.  Exam stable. Labs pending.  Follow up in 1 year for repeat physical.

## 2022-12-03 NOTE — Assessment & Plan Note (Signed)
Mammogram UTD. 

## 2022-12-03 NOTE — Assessment & Plan Note (Signed)
Overall controlled except for certain trigger foods.  Resume famotidine 20 mg daily.  Rx sent to pharmacy.

## 2022-12-03 NOTE — Assessment & Plan Note (Signed)
Controlled. ? ?Continue gabapentin 200 mg HS. ?

## 2022-12-03 NOTE — Assessment & Plan Note (Signed)
Repeat lipid panel pending.  Discussed the importance of a healthy diet and regular exercise in order for weight loss, and to reduce the risk of further co-morbidity. Continue atorvastatin 10 mg daily.  

## 2022-12-03 NOTE — Assessment & Plan Note (Signed)
Controlled.  Continue Lexapro 20 mg daily, Buspar 15 mg BID, alprazolam 0.5 mg PRN.

## 2022-12-10 ENCOUNTER — Encounter: Payer: Self-pay | Admitting: *Deleted

## 2022-12-23 ENCOUNTER — Other Ambulatory Visit: Payer: Self-pay | Admitting: Primary Care

## 2022-12-23 DIAGNOSIS — E119 Type 2 diabetes mellitus without complications: Secondary | ICD-10-CM

## 2022-12-23 DIAGNOSIS — G4700 Insomnia, unspecified: Secondary | ICD-10-CM

## 2022-12-23 DIAGNOSIS — E1169 Type 2 diabetes mellitus with other specified complication: Secondary | ICD-10-CM

## 2022-12-23 DIAGNOSIS — F411 Generalized anxiety disorder: Secondary | ICD-10-CM

## 2023-01-26 ENCOUNTER — Ambulatory Visit: Payer: PRIVATE HEALTH INSURANCE | Admitting: Primary Care

## 2023-01-28 ENCOUNTER — Ambulatory Visit: Payer: PRIVATE HEALTH INSURANCE | Admitting: Primary Care

## 2023-01-28 VITALS — BP 124/80 | HR 85 | Temp 97.9°F | Ht 61.0 in | Wt 243.0 lb

## 2023-01-28 DIAGNOSIS — F411 Generalized anxiety disorder: Secondary | ICD-10-CM | POA: Diagnosis not present

## 2023-01-28 DIAGNOSIS — L301 Dyshidrosis [pompholyx]: Secondary | ICD-10-CM | POA: Diagnosis not present

## 2023-01-28 DIAGNOSIS — Z9884 Bariatric surgery status: Secondary | ICD-10-CM | POA: Diagnosis not present

## 2023-01-28 MED ORDER — ALPRAZOLAM 0.5 MG PO TABS
0.5000 mg | ORAL_TABLET | Freq: Every day | ORAL | 0 refills | Status: DC | PRN
Start: 2023-01-28 — End: 2023-06-22

## 2023-01-28 MED ORDER — CLOBETASOL PROPIONATE 0.05 % EX CREA
1.0000 | TOPICAL_CREAM | Freq: Two times a day (BID) | CUTANEOUS | 0 refills | Status: DC
Start: 2023-01-28 — End: 2023-03-14

## 2023-01-28 NOTE — Patient Instructions (Signed)
Apply the clobetasol cream twice daily for 1 week, then use as needed.  Do not apply this to your face.  Stop by the lab prior to leaving today. I will notify you of your results once received.   It was a pleasure to see you today!

## 2023-01-28 NOTE — Assessment & Plan Note (Signed)
Mild to moderate case.  Treat with clobetasol 0.05% cream twice daily as needed. Follow-up as needed.

## 2023-01-28 NOTE — Assessment & Plan Note (Signed)
Labs ordered per bariatric surgeon request. She will forward these labs to her surgeon.

## 2023-01-28 NOTE — Progress Notes (Signed)
Subjective:    Patient ID: Jaime Gilmore, female    DOB: [birth year], 47 y.o.   MRN: [id number]  Rash    Jaime Gilmore is a very pleasant 47 y.o. female with a history of type 2 diabetes, hyperlipidemia, chronic back pain, morbid obesity status post bariatric surgery who presents today for lab work for bariatric surgeon and to discuss skin blisters.  She is needing multiple labs from her bariatric surgeon including vitamin D, CBC, CMP, copper, ferritin, folate, iron and total iron, magnesium, PTH, phosphorus, transferrin, vitamin A, vitamin B1, vitamin B12, vitamin K1, zinc.  Her blisters are located to the bilateral palmer hands and along side of her fingers. The blisters on the sides of her thumbs began years ago which typically split and crack. Over the last 2 months she's noticed progression of her finger blisters and then to the palmer hands which will peel and then burn. About 2 months ago she experienced an increased amount of stress.  She's applied mupirocin ointment twice which has helped to dry up her blisters but without relief. She's tried several OTC products, including Aveno with 1% hydrocortisone which has not helped.   She is also requesting a refill of her Xanax.    Review of Systems  Skin:  Positive for color change and rash.  Psychiatric/Behavioral:  The patient is nervous/anxious.          Past Medical History:  Diagnosis Date   Acute knee pain 06/06/2021   Anxiety    BRCA gene mutation negative in female 06/2015   Cervical high risk HPV (human papillomavirus) test positive 05/15/09;05/22/10;12/16/10;2014   hpv pos   COVID-19 virus infection 02/05/2020   Dysmenorrhea    Family history of breast cancer 06/2015   History of mammogram 02/19/2015   BIRAD I   History of Papanicolaou smear of cervix 06/25/2015   RNIL;NEG   Hyperlipidemia    Increased risk of breast cancer 08/2015   IBIS=24%   Insomnia    Onychomycosis 06/12/2014   Pap smear abnormality of  cervix with ASCUS favoring dysplasia 05/22/10;12/16/10;11/27/11;05/30/12   Pap smear abnormality of cervix with LGSIL 05/15/2009   Polycystic ovaries    Type 2 diabetes mellitus (HCC)    Vitamin D deficiency     Social History   Socioeconomic History   Marital status: Married    Spouse name: Not on file   Number of children: 0   Years of education: 14   Highest education level: Associate degree: occupational, Scientist, product/process development, or vocational program  Occupational History   Occupation: business  Tobacco Use   Smoking status: Every Day    Current packs/day: 1.00    Types: Cigarettes   Smokeless tobacco: Never  Vaping Use   Vaping status: Never Used  Substance and Sexual Activity   Alcohol use: No   Drug use: No   Sexual activity: Not Currently    Birth control/protection: I.U.D.    Comment: Mirena  Other Topics Concern   Not on file  Social History Narrative   Married.   No children.    Works in HR.   Enjoys watching movies, swimming.    Social Determinants of Health   Financial Resource Strain: Low Risk  (01/28/2023)   Overall Financial Resource Strain (CARDIA)    Difficulty of Paying Living Expenses: Not hard at all  Food Insecurity: No Food Insecurity (01/28/2023)   Hunger Vital Sign    Worried About Running Out of Food in the Last Year: Never  true    Ran Out of Food in the Last Year: Never true  Transportation Needs: No Transportation Needs (01/28/2023)   PRAPARE - Administrator, Civil Service (Medical): No    Lack of Transportation (Non-Medical): No  Physical Activity: Unknown (01/28/2023)   Exercise Vital Sign    Days of Exercise per Week: 0 days    Minutes of Exercise per Session: Not on file  Stress: Stress Concern Present (01/28/2023)   [employer] of Occupational Health - Occupational Stress Questionnaire    Feeling of Stress : To some extent  Social Connections: Moderately Isolated (01/28/2023)   Social Connection and Isolation Panel [NHANES]     Frequency of Communication with Friends and Family: More than three times a week    Frequency of Social Gatherings with Friends and Family: Three times a week    Attends Religious Services: Never    Active Member of Clubs or Organizations: No    Attends Engineer, structural: Not on file    Marital Status: Married  Catering manager Violence: Not on file    Past Surgical History:  Procedure Laterality Date   CERVICAL BIOPSY  W/ LOOP ELECTRODE EXCISION  06/2012   PATH NEG   COLPOSCOPY  05/15/09;2/17;12/3; 5/13   NO LESIONS   GASTRIC BYPASS  11/21/2021   INTRAUTERINE DEVICE (IUD) INSERTION  08/2007   IUD REMOVAL  05/30/2012   LAPAROSCOPIC CHOLECYSTECTOMY  2004   SKIN LESION EXCISION  2007   DERMATOFIBROMA    Family History  Problem Relation Age of Onset   Breast cancer Maternal Aunt 40       no contact   Breast cancer Mother 39   Anemia Mother    High Cholesterol Mother    Hypertension Father    Cancer Maternal Uncle        pancreatic   Diabetes Maternal Grandmother    Hypertension Paternal Grandmother    Hearing loss Paternal Grandmother    Heart disease Paternal Grandmother    High Cholesterol Paternal Grandmother    Cancer Cousin        ovarian- cured, no contact   Hearing loss Paternal Grandfather    Early death Paternal Grandfather     No Known Allergies  Current Outpatient Medications on File Prior to Visit  Medication Sig Dispense Refill   busPIRone (BUSPAR) 15 MG tablet TAKE 1 TABLET (15 MG TOTAL) BY MOUTH 3 (THREE) TIMES DAILY. FOR ANXIETY. 270 tablet 0   escitalopram (LEXAPRO) 20 MG tablet TAKE 1 TABLET (20 MG TOTAL) BY MOUTH DAILY. FOR ANXIETY. 90 tablet 3   famotidine (PEPCID) 20 MG tablet Take 1 tablet (20 mg total) by mouth daily. For heartburn 90 tablet 3   gabapentin (NEURONTIN) 100 MG capsule TAKE 2 CAPSULES (200 MG TOTAL) BY MOUTH AT BEDTIME. FOR PAIN 180 capsule 0   traZODone (DESYREL) 100 MG tablet TAKE 1 TABLET (100 MG TOTAL) BY MOUTH AT  BEDTIME. FOR SLEEP. 90 tablet 3   atorvastatin (LIPITOR) 10 MG tablet TAKE 1 TABLET BY MOUTH EVERY DAY FOR CHOLESTEROL (Patient not taking: Reported on 01/28/2023) 90 tablet 3   levonorgestrel (MIRENA) 20 MCG/DAY IUD 1 each by Intrauterine route once for 1 dose. 1 each 0   No current facility-administered medications on file prior to visit.    BP 124/80   Pulse 85   Temp 97.9 F (36.6 C) (Temporal)   Ht 5\' 1"  (1.549 m)   Wt 243 lb (110.2 kg)  SpO2 98%   BMI 45.91 kg/m  Objective:   Physical Exam Cardiovascular:     Rate and Rhythm: Normal rate and regular rhythm.  Pulmonary:     Effort: Pulmonary effort is normal.     Breath sounds: Normal breath sounds.  Musculoskeletal:     Cervical back: Neck supple.  Skin:    General: Skin is warm and dry.     Findings: Erythema present.     Comments: Dry, cracked skin noted to several lateral fingers of bilateral hands.  Large open noncomplicated blister to right palmar hand with mild erythema, no drainage.  Neurological:     Mental Status: She is alert and oriented to person, place, and time.  Psychiatric:        Mood and Affect: Mood normal.           Assessment & Plan:  Dyshidrotic eczema Assessment & Plan: Mild to moderate case.  Treat with clobetasol 0.05% cream twice daily as needed. Follow-up as needed.  Orders: -     Clobetasol Propionate; Apply 1 Application topically 2 (two) times daily.  Dispense: 30 g; Refill: 0  GAD (generalized anxiety disorder) -     ALPRAZolam; Take 1 tablet (0.5 mg total) by mouth daily as needed for anxiety. Use sparingly.  Dispense: 10 tablet; Refill: 0  Status post bariatric surgery Assessment & Plan: Labs ordered per bariatric surgeon request. She will forward these labs to her surgeon.  Orders: -     VITAMIN D 25 Hydroxy (Vit-D Deficiency, Fractures) -     CBC -     Comprehensive metabolic panel -     IBC + Ferritin -     Copper, serum -     Folate -     Magnesium -      Parathyroid hormone, intact (no Ca) -     Phosphorus -     Transferrin -     Vitamin A -     Vitamin B1 -     Vitamin K1, Serum -     Zinc -     Vitamin B12        Doreene Nest, NP

## 2023-01-29 LAB — MAGNESIUM: Magnesium: 1.6 mg/dL (ref 1.5–2.5)

## 2023-01-29 LAB — VITAMIN B12: Vitamin B-12: 1010 pg/mL — ABNORMAL HIGH (ref 211–911)

## 2023-01-29 LAB — COMPREHENSIVE METABOLIC PANEL
ALT: 38 U/L — ABNORMAL HIGH (ref 0–35)
AST: 19 U/L (ref 0–37)
Albumin: 3.9 g/dL (ref 3.5–5.2)
Alkaline Phosphatase: 101 U/L (ref 39–117)
BUN: 4 mg/dL — ABNORMAL LOW (ref 6–23)
CO2: 23 meq/L (ref 19–32)
Calcium: 9 mg/dL (ref 8.4–10.5)
Chloride: 103 meq/L (ref 96–112)
Creatinine, Ser: 0.55 mg/dL (ref 0.40–1.20)
GFR: 107.55 mL/min (ref >60.00)
Glucose, Bld: 96 mg/dL (ref 70–99)
Potassium: 3.7 meq/L (ref 3.5–5.1)
Sodium: 137 meq/L (ref 135–145)
Total Bilirubin: 0.4 mg/dL (ref 0.2–1.2)
Total Protein: 6 g/dL (ref 6.0–8.3)

## 2023-01-29 LAB — VITAMIN D 25 HYDROXY (VIT D DEFICIENCY, FRACTURES): VITD: 15.67 ng/mL — ABNORMAL LOW (ref 30.00–100.00)

## 2023-01-29 LAB — CBC
HCT: 39.8 % (ref 36.0–46.0)
Hemoglobin: 13 g/dL (ref 12.0–15.0)
MCHC: 32.6 g/dL (ref 30.0–36.0)
MCV: 93.2 fL (ref 78.0–100.0)
Platelets: 329 10*3/uL (ref 150.0–400.0)
RBC: 4.27 Mil/uL (ref 3.87–5.11)
RDW: 13.4 % (ref 11.5–15.5)
WBC: 13.9 10*3/uL — ABNORMAL HIGH (ref 4.0–10.5)

## 2023-01-29 LAB — IBC + FERRITIN
Ferritin: 67.3 ng/mL (ref 10.0–291.0)
Iron: 45 ug/dL (ref 42–145)
Saturation Ratios: 11.5 % — ABNORMAL LOW (ref 20.0–50.0)
TIBC: 392 ug/dL (ref 250.0–450.0)
Transferrin: 280 mg/dL (ref 212.0–360.0)

## 2023-01-29 LAB — TRANSFERRIN: Transferrin: 280 mg/dL (ref 212.0–360.0)

## 2023-01-29 LAB — PHOSPHORUS: Phosphorus: 3.7 mg/dL (ref 2.3–4.6)

## 2023-01-29 LAB — FOLATE: Folate: 15.7 ng/mL (ref >5.9)

## 2023-02-04 LAB — VITAMIN K1, SERUM: Vitamin K: 203 pg/mL (ref 130–1500)

## 2023-02-04 LAB — PARATHYROID HORMONE, INTACT (NO CA): PTH: 58 pg/mL (ref 16–77)

## 2023-02-04 LAB — VITAMIN B1: Vitamin B1 (Thiamine): 21 nmol/L (ref 8–30)

## 2023-02-04 LAB — ZINC: Zinc: 41 ug/dL — ABNORMAL LOW (ref 60–130)

## 2023-02-04 LAB — COPPER, SERUM: Copper: 100 ug/dL (ref 70–175)

## 2023-02-04 LAB — VITAMIN A: Vitamin A (Retinoic Acid): 42 ug/dL (ref 38–98)

## 2023-02-19 ENCOUNTER — Other Ambulatory Visit: Payer: Self-pay | Admitting: Primary Care

## 2023-02-19 DIAGNOSIS — F411 Generalized anxiety disorder: Secondary | ICD-10-CM

## 2023-03-03 ENCOUNTER — Other Ambulatory Visit: Payer: Self-pay | Admitting: Primary Care

## 2023-03-03 DIAGNOSIS — G8929 Other chronic pain: Secondary | ICD-10-CM

## 2023-03-03 MED ORDER — GABAPENTIN 100 MG PO CAPS
200.0000 mg | ORAL_CAPSULE | Freq: Every day | ORAL | 2 refills | Status: DC
Start: 2023-03-03 — End: 2024-01-04

## 2023-03-13 ENCOUNTER — Other Ambulatory Visit: Payer: Self-pay | Admitting: Primary Care

## 2023-03-13 DIAGNOSIS — L301 Dyshidrosis [pompholyx]: Secondary | ICD-10-CM

## 2023-03-28 ENCOUNTER — Other Ambulatory Visit: Payer: Self-pay | Admitting: Primary Care

## 2023-03-28 DIAGNOSIS — E119 Type 2 diabetes mellitus without complications: Secondary | ICD-10-CM

## 2023-05-25 ENCOUNTER — Telehealth: Payer: PRIVATE HEALTH INSURANCE | Admitting: Family Medicine

## 2023-05-25 DIAGNOSIS — R6889 Other general symptoms and signs: Secondary | ICD-10-CM | POA: Diagnosis not present

## 2023-05-25 MED ORDER — OSELTAMIVIR PHOSPHATE 75 MG PO CAPS
75.0000 mg | ORAL_CAPSULE | Freq: Two times a day (BID) | ORAL | 0 refills | Status: AC
Start: 2023-05-25 — End: 2023-05-30

## 2023-05-25 NOTE — Patient Instructions (Addendum)
 Jaime Gilmore, thank you for joining Jaime CHRISTELLA Barefoot, NP for today's virtual visit.  While this provider is not your primary care provider (PCP), if your PCP is located in our provider database this encounter information will be shared with them immediately following your visit.   A [facility] MyChart account gives you access to today's visit and all your visits, tests, and labs performed at [facility]  click here if you don't have a [facility] MyChart account or go to [email or web address]  Consent: (Patient) Jaime Gilmore provided verbal consent for this virtual visit at the beginning of the encounter.  Current Medications:  Current Outpatient Medications:    oseltamivir  (TAMIFLU ) 75 MG capsule, Take 1 capsule (75 mg total) by mouth 2 (two) times daily for 5 days., Disp: 10 capsule, Rfl: 0   ALPRAZolam  (XANAX ) 0.5 MG tablet, Take 1 tablet (0.5 mg total) by mouth daily as needed for anxiety. Use sparingly., Disp: 10 tablet, Rfl: 0   atorvastatin  (LIPITOR) 10 MG tablet, TAKE 1 TABLET BY MOUTH EVERY DAY FOR CHOLESTEROL (Patient not taking: Reported on 01/28/2023), Disp: 90 tablet, Rfl: 3   busPIRone  (BUSPAR ) 15 MG tablet, TAKE 1 TABLET (15 MG TOTAL) BY MOUTH 3 (THREE) TIMES DAILY. FOR ANXIETY., Disp: 270 tablet, Rfl: 2   clobetasol  cream (TEMOVATE ) 0.05 %, APPLY TO AFFECTED AREA TWICE A DAY, Disp: 30 g, Rfl: 0   escitalopram  (LEXAPRO ) 20 MG tablet, TAKE 1 TABLET (20 MG TOTAL) BY MOUTH DAILY. FOR ANXIETY., Disp: 90 tablet, Rfl: 3   famotidine  (PEPCID ) 20 MG tablet, Take 1 tablet (20 mg total) by mouth daily. For heartburn, Disp: 90 tablet, Rfl: 3   gabapentin  (NEURONTIN ) 100 MG capsule, Take 2 capsules (200 mg total) by mouth at bedtime. For pain, Disp: 180 capsule, Rfl: 2   levonorgestrel  (MIRENA ) 20 MCG/DAY IUD, 1 each by Intrauterine route once for 1 dose., Disp: 1 each, Rfl: 0   traZODone  (DESYREL ) 100 MG tablet, TAKE 1 TABLET (100 MG TOTAL) BY MOUTH AT BEDTIME. FOR  SLEEP., Disp: 90 tablet, Rfl: 3   Medications ordered in this encounter:  Meds ordered this encounter  Medications   oseltamivir  (TAMIFLU ) 75 MG capsule    Sig: Take 1 capsule (75 mg total) by mouth 2 (two) times daily for 5 days.    Dispense:  10 capsule    Refill:  0    Supervising Provider:   BLAISE ALEENE KIDD [[id number]]     *If you need refills on other medications prior to your next appointment, please contact your pharmacy*  Follow-Up: Call back or seek an in-person evaluation if the symptoms worsen or if the condition fails to improve as anticipated.  [facility] [phone]  Other Instructions  - Continue OTC symptomatic management of choice  - Take prescribed medications as directed - Push fluids - Rest as needed   If you have been instructed to have an in-person evaluation today at a local Urgent Care facility, please use the link below. It will take you to a list of all of our available [facility], including address, phone number and hours of operation. Please do not delay care.  [facility]  If you or a family member do not have a primary care provider, use the link below to schedule a visit and establish care. When you choose a  primary care physician or advanced practice provider, you gain a long-term partner in health. Find a Primary  Care Provider  Learn more about [facility]'s in-office and virtual care options: [facility] - Get Care Now

## 2023-05-25 NOTE — Progress Notes (Signed)
 Virtual Visit Consent   Jaime Gilmore, you are scheduled for a virtual visit with a [name] provider today. Just as with appointments in the office, your consent must be obtained to participate. Your consent will be active for this visit and any virtual visit you may have with one of our providers in the next 365 days. If you have a MyChart account, a copy of this consent can be sent to you electronically.  As this is a virtual visit, video technology does not allow for your provider to perform a traditional examination. This may limit your provider's ability to fully assess your condition. If your provider identifies any concerns that need to be evaluated in person or the need to arrange testing (such as labs, EKG, etc.), we will make arrangements to do so. Although advances in technology are sophisticated, we cannot ensure that it will always work on either your end or our end. If the connection with a video visit is poor, the visit may have to be switched to a telephone visit. With either a video or telephone visit, we are not always able to ensure that we have a secure connection.  By engaging in this virtual visit, you consent to the provision of healthcare and authorize for your insurance to be billed (if applicable) for the services provided during this visit. Depending on your insurance coverage, you may receive a charge related to this service.  I need to obtain your verbal consent now. Are you willing to proceed with your visit today? Jaime Gilmore has provided verbal consent on 05/25/2023 for a virtual visit (video or telephone). Chiquita CHRISTELLA Barefoot, NP  Date: 05/25/2023 9:20 AM  Virtual Visit via Video Note   I, Chiquita CHRISTELLA Barefoot, connected with  Jaime Gilmore  ([id number], [birth year]) on 05/25/23 at  9:30 AM EST by a video-enabled telemedicine application and verified that I am speaking with the correct person using two identifiers.  Location: Patient: Virtual Visit Location Patient:  Home Provider: Virtual Visit Location Provider: Home Office   I discussed the limitations of evaluation and management by telemedicine and the availability of in person appointments. The patient expressed understanding and agreed to proceed.    History of Present Illness: Jaime Gilmore is a 48 y.o. who identifies as a female who was assigned female at birth, and is being seen today for flu like symptoms  Onset was yesterday- cough, congestion Associated symptoms are lungs feel like they are burning, headache, and body aches, chills, trouble sleeping Modifying factors are OTC cold medication  Denies chest pain, shortness of breath, fever   Exposure to sick contacts- unknown   Problems:  Patient Active Problem List   Diagnosis Date Noted   Status post bariatric surgery 01/28/2023   Dyshidrotic eczema 01/28/2023   Preventative health care 12/03/2022   Elevated LFTs 08/07/2022   Vitamin D  deficiency 08/07/2022   Morbid obesity with BMI of 40.0-44.9, adult (HCC) 09/04/2020   Eye swelling, left 08/14/2020   Family history of breast cancer 07/02/2020   Atypical glandular cells of undetermined significance (AGUS) on cervical Pap smear 07/01/2020   Insomnia 05/15/2020   Chronic back pain 02/05/2020   PCOS (polycystic ovarian syndrome) 11/16/2018   Hyperlipidemia associated with type 2 diabetes mellitus (HCC) 11/16/2018   Allergic rhinitis 04/27/2018   Gastroesophageal reflux disease 04/27/2018   GAD (generalized anxiety disorder) 11/12/2017   Type 2 diabetes mellitus (HCC) 06/29/2016   Encounter for annual routine gynecological examination 06/25/2016  Increased risk of breast cancer 06/25/2016    Allergies: No Known Allergies Medications:  Current Outpatient Medications:    ALPRAZolam  (XANAX ) 0.5 MG tablet, Take 1 tablet (0.5 mg total) by mouth daily as needed for anxiety. Use sparingly., Disp: 10 tablet, Rfl: 0   atorvastatin  (LIPITOR) 10 MG tablet, TAKE 1 TABLET BY MOUTH EVERY  DAY FOR CHOLESTEROL (Patient not taking: Reported on 01/28/2023), Disp: 90 tablet, Rfl: 3   busPIRone  (BUSPAR ) 15 MG tablet, TAKE 1 TABLET (15 MG TOTAL) BY MOUTH 3 (THREE) TIMES DAILY. FOR ANXIETY., Disp: 270 tablet, Rfl: 2   clobetasol  cream (TEMOVATE ) 0.05 %, APPLY TO AFFECTED AREA TWICE A DAY, Disp: 30 g, Rfl: 0   escitalopram  (LEXAPRO ) 20 MG tablet, TAKE 1 TABLET (20 MG TOTAL) BY MOUTH DAILY. FOR ANXIETY., Disp: 90 tablet, Rfl: 3   famotidine  (PEPCID ) 20 MG tablet, Take 1 tablet (20 mg total) by mouth daily. For heartburn, Disp: 90 tablet, Rfl: 3   gabapentin  (NEURONTIN ) 100 MG capsule, Take 2 capsules (200 mg total) by mouth at bedtime. For pain, Disp: 180 capsule, Rfl: 2   levonorgestrel  (MIRENA ) 20 MCG/DAY IUD, 1 each by Intrauterine route once for 1 dose., Disp: 1 each, Rfl: 0   traZODone  (DESYREL ) 100 MG tablet, TAKE 1 TABLET (100 MG TOTAL) BY MOUTH AT BEDTIME. FOR SLEEP., Disp: 90 tablet, Rfl: 3  Observations/Objective: Patient is well-developed, well-nourished in no acute distress.  Resting comfortably  at home.  Head is normocephalic, atraumatic.  No labored breathing.  Speech is clear and coherent with logical content.  Patient is alert and oriented at baseline.    Assessment and Plan:   1. Flu-like symptoms (Primary)  - oseltamivir  (TAMIFLU ) 75 MG capsule; Take 1 capsule (75 mg total) by mouth 2 (two) times daily for 5 days.  Dispense: 10 capsule; Refill: 0  - Continue OTC symptomatic management of choice  - Take prescribed medications as directed - Push fluids - Rest as needed - Discussed return precautions and when to seek in-person evaluation, sent via AVS as well   Reviewed side effects, risks and benefits of medication.    Patient acknowledged agreement and understanding of the plan.   Past Medical, Surgical, Social History, Allergies, and Medications have been Reviewed.    Follow Up Instructions: I discussed the assessment and treatment plan with the  patient. The patient was provided an opportunity to ask questions and all were answered. The patient agreed with the plan and demonstrated an understanding of the instructions.  A copy of instructions were sent to the patient via MyChart unless otherwise noted below.    The patient was advised to call back or seek an in-person evaluation if the symptoms worsen or if the condition fails to improve as anticipated.    Chiquita CHRISTELLA Barefoot, NP

## 2023-05-27 ENCOUNTER — Encounter: Payer: Self-pay | Admitting: Family Medicine

## 2023-05-27 ENCOUNTER — Telehealth: Payer: PRIVATE HEALTH INSURANCE | Admitting: Physician Assistant

## 2023-05-27 DIAGNOSIS — R079 Chest pain, unspecified: Secondary | ICD-10-CM

## 2023-05-27 DIAGNOSIS — R062 Wheezing: Secondary | ICD-10-CM

## 2023-05-27 NOTE — Progress Notes (Signed)
  Because of associated chest pain and need for examination, I feel your condition warrants further evaluation and I recommend that you be seen in a face-to-face visit.   NOTE: There will be NO CHARGE for this E-Visit   If you are having a true medical emergency, please call 911.     For an urgent face to face visit, [facility] has multiple urgent care centers for your convenience.  Click the link below for the full list of locations and hours, walk-in wait times, appointment scheduling options and driving directions:  Urgent Care - [place], [place], [place], [place], [place], [place]  [place]     Your MyChart E-visit questionnaire answers were reviewed by a board certified advanced clinical practitioner to complete your personal care plan based on your specific symptoms.    Thank you for using e-Visits.

## 2023-06-22 ENCOUNTER — Other Ambulatory Visit: Payer: Self-pay | Admitting: Primary Care

## 2023-06-22 DIAGNOSIS — F411 Generalized anxiety disorder: Secondary | ICD-10-CM

## 2023-06-22 MED ORDER — ALPRAZOLAM 0.5 MG PO TABS
0.5000 mg | ORAL_TABLET | Freq: Every day | ORAL | 0 refills | Status: DC | PRN
Start: 2023-06-22 — End: 2024-02-15

## 2023-09-17 ENCOUNTER — Encounter: Payer: Self-pay | Admitting: Primary Care

## 2023-09-17 ENCOUNTER — Other Ambulatory Visit (HOSPITAL_COMMUNITY)
Admission: RE | Admit: 2023-09-17 | Discharge: 2023-09-17 | Disposition: A | Discharge status: Home / Self Care | Payer: PRIVATE HEALTH INSURANCE | Source: Ambulatory Visit | Attending: Primary Care | Admitting: Primary Care

## 2023-09-17 ENCOUNTER — Ambulatory Visit: Payer: PRIVATE HEALTH INSURANCE | Admitting: Primary Care

## 2023-09-17 VITALS — BP 124/68 | HR 81 | Temp 97.2°F | Ht 61.0 in | Wt 230.0 lb

## 2023-09-17 DIAGNOSIS — D229 Melanocytic nevi, unspecified: Secondary | ICD-10-CM | POA: Insufficient documentation

## 2023-09-17 DIAGNOSIS — L918 Other hypertrophic disorders of the skin: Secondary | ICD-10-CM

## 2023-09-17 DIAGNOSIS — L821 Other seborrheic keratosis: Secondary | ICD-10-CM

## 2023-09-17 NOTE — Assessment & Plan Note (Addendum)
 Will remove due to location and bothersome nature.  Written consent obtained. Site cleansed with Betadine solution Pain ease spray used for analgesia Nevus removed with shave biopsy tool and forceps Minimal bleeding which was stopped with silver nitrate sticks Site cleansed again with Betadine solution Dressing applied Patient tolerated well  Home instructions provided.

## 2023-09-17 NOTE — Patient Instructions (Signed)
 We will be in touch also received the pathology results from your mole.  Keep the sites clean and dry.  It was a pleasure to see you today!

## 2023-09-17 NOTE — Addendum Note (Signed)
 Addended by: Kyle Pho on: 09/17/2023 12:22 PM   Modules accepted: Orders

## 2023-09-17 NOTE — Assessment & Plan Note (Addendum)
 Appears benign.  Removed as it is bothersome.  Written consent obtained. Site cleansed with Betadine solution Pain ease spray used for analgesia Nevus removed with shave biopsy tool and forceps Minimal bleeding which was stopped with silver nitrate sticks Site cleansed again with Betadine solution Dressing applied  Home instructions provided. Nevus was sent off for pathology  Patient tolerated well

## 2023-09-17 NOTE — Progress Notes (Signed)
 Subjective:    Patient ID: Jaime Gilmore, female    DOB: 12-15-1975, 48 y.o.   MRN: [id number]  HPI  Jaime Gilmore is a very pleasant 48 y.o. female with a history of hyperlipidemia, type 2 diabetes, PCOS, chronic back pain who presents today for skin tag and mole removal.   She would like skin tags removed from the back of her neck and a mole removed from the back of her left knee. She's attempted to remove her skin tags to the neck with chemicals and by cutting them off.   Her skin tags are bothersome around the neck, snag on clothing and necklaces. The mole to the back of her left knee she first noticed a few weeks ago, no changes in size. She does pick at it. Wants it removed as it is bothersome.    Review of Systems  Constitutional:  Negative for fever.  Skin:  Negative for color change.       Multiple skin tags to neckline.  1 nevus to left posterior knee         Past Medical History:  Diagnosis Date   Acute knee pain 06/06/2021   Anxiety    BRCA gene mutation negative in female 06/2015   Cervical high risk HPV (human papillomavirus) test positive 05/15/09;05/22/10;12/16/10;2014   hpv pos   COVID-19 virus infection 02/05/2020   Dysmenorrhea    Family history of breast cancer 06/2015   History of mammogram 02/19/2015   BIRAD I   History of Papanicolaou smear of cervix 06/25/2015   RNIL;NEG   Hyperlipidemia    Increased risk of breast cancer 08/2015   IBIS=24%   Insomnia    Onychomycosis 06/12/2014   Pap smear abnormality of cervix with ASCUS favoring dysplasia 05/22/10;12/16/10;11/27/11;05/30/12   Pap smear abnormality of cervix with LGSIL 05/15/2009   Polycystic ovaries    Type 2 diabetes mellitus (HCC)    Vitamin D  deficiency     Social History   Socioeconomic History   Marital status: Married    Spouse name: Not on file   Number of children: 0   Years of education: 14   Highest education level: Associate degree: academic program  Occupational History    Occupation: business  Tobacco Use   Smoking status: Every Day    Current packs/day: 1.00    Types: Cigarettes   Smokeless tobacco: Never  Vaping Use   Vaping status: Never Used  Substance and Sexual Activity   Alcohol use: No   Drug use: No   Sexual activity: Not Currently    Birth control/protection: I.U.D.    Comment: Mirena   Other Topics Concern   Not on file  Social History Narrative   Married.   No children.    Works in HR.   Enjoys watching movies, swimming.    Social Drivers of Corporate investment banker Strain: Low Risk  (09/16/2023)   Overall Financial Resource Strain (CARDIA)    Difficulty of Paying Living Expenses: Not very hard  Food Insecurity: No Food Insecurity (09/16/2023)   Hunger Vital Sign    Worried About Running Out of Food in the Last Year: Never true    Ran Out of Food in the Last Year: Never true  Transportation Needs: No Transportation Needs (09/16/2023)   PRAPARE - Administrator, Civil Service (Medical): No    Lack of Transportation (Non-Medical): No  Physical Activity: Insufficiently Active (09/16/2023)   Exercise Vital Sign    Days  of Exercise per Week: 1 day    Minutes of Exercise per Session: 30 min  Stress: Stress Concern Present (09/16/2023)   [employer] of Occupational Health - Occupational Stress Questionnaire    Feeling of Stress : To some extent  Social Connections: Moderately Isolated (09/16/2023)   Social Connection and Isolation Panel [NHANES]    Frequency of Communication with Friends and Family: More than three times a week    Frequency of Social Gatherings with Friends and Family: Once a week    Attends Religious Services: Never    Database administrator or Organizations: No    Attends Engineer, structural: Not on file    Marital Status: Married  Catering manager Violence: Not on file    Past Surgical History:  Procedure Laterality Date   CERVICAL BIOPSY  W/ LOOP ELECTRODE EXCISION  06/2012   PATH  NEG   COLPOSCOPY  05/15/09;2/17;12/3; 5/13   NO LESIONS   GASTRIC BYPASS  11/21/2021   INTRAUTERINE DEVICE (IUD) INSERTION  08/2007   IUD REMOVAL  05/30/2012   LAPAROSCOPIC CHOLECYSTECTOMY  2004   SKIN LESION EXCISION  2007   DERMATOFIBROMA    Family History  Problem Relation Age of Onset   Breast cancer Maternal Aunt 40       no contact   Breast cancer Mother 39   Anemia Mother    High Cholesterol Mother    Hypertension Father    Cancer Maternal Uncle        pancreatic   Diabetes Maternal Grandmother    Hypertension Paternal Grandmother    Hearing loss Paternal Grandmother    Heart disease Paternal Grandmother    High Cholesterol Paternal Grandmother    Cancer Cousin        ovarian- cured, no contact   Hearing loss Paternal Grandfather    Early death Paternal Grandfather     No Known Allergies  Current Outpatient Medications on File Prior to Visit  Medication Sig Dispense Refill   ALPRAZolam  (XANAX ) 0.5 MG tablet Take 1 tablet (0.5 mg total) by mouth daily as needed for anxiety. Use sparingly. 10 tablet 0   busPIRone  (BUSPAR ) 15 MG tablet TAKE 1 TABLET (15 MG TOTAL) BY MOUTH 3 (THREE) TIMES DAILY. FOR ANXIETY. 270 tablet 2   clobetasol  cream (TEMOVATE ) 0.05 % APPLY TO AFFECTED AREA TWICE A DAY 30 g 0   escitalopram  (LEXAPRO ) 20 MG tablet TAKE 1 TABLET (20 MG TOTAL) BY MOUTH DAILY. FOR ANXIETY. 90 tablet 3   famotidine  (PEPCID ) 20 MG tablet Take 1 tablet (20 mg total) by mouth daily. For heartburn 90 tablet 3   gabapentin  (NEURONTIN ) 100 MG capsule Take 2 capsules (200 mg total) by mouth at bedtime. For pain 180 capsule 2   traZODone  (DESYREL ) 100 MG tablet TAKE 1 TABLET (100 MG TOTAL) BY MOUTH AT BEDTIME. FOR SLEEP. 90 tablet 3   atorvastatin  (LIPITOR) 10 MG tablet TAKE 1 TABLET BY MOUTH EVERY DAY FOR CHOLESTEROL (Patient not taking: Reported on 09/17/2023) 90 tablet 3   levonorgestrel  (MIRENA ) 20 MCG/DAY IUD 1 each by Intrauterine route once for 1 dose. 1 each 0   No  current facility-administered medications on file prior to visit.    BP 124/68   Pulse 81   Temp (!) 97.2 F (36.2 C) (Temporal)   Ht 5\' 1"  (1.549 m)   Wt 230 lb (104.3 kg)   SpO2 98%   BMI 43.46 kg/m  Objective:   Physical Exam Skin:  General: Skin is warm and dry.     Comments: Three small skin tags to left lateral neck Eight small skin tags to right lateral and posterior neck  One 0.5 cm rounded, raised, scaly, flesh colored nevus to left posterior knee           Assessment & Plan:  Nevus Assessment & Plan: Appears benign.  Removed as it is bothersome.  Written consent obtained. Site cleansed with Betadine solution Pain ease spray used for analgesia Nevus removed with shave biopsy tool and forceps Minimal bleeding which was stopped with silver nitrate sticks Site cleansed again with Betadine solution Dressing applied  Home instructions provided. Nevus was sent off for pathology  Patient tolerated well   Multiple acquired skin tags Assessment & Plan: Will remove due to location and bothersome nature.  Written consent obtained. Site cleansed with Betadine solution Pain ease spray used for analgesia Nevus removed with shave biopsy tool and forceps Minimal bleeding which was stopped with silver nitrate sticks Site cleansed again with Betadine solution Dressing applied Patient tolerated well  Home instructions provided.          Younes Degeorge K Ireoluwa Grant, NP

## 2023-09-20 LAB — SURGICAL PATHOLOGY

## 2023-09-21 ENCOUNTER — Ambulatory Visit: Payer: Self-pay | Admitting: Primary Care

## 2023-10-02 ENCOUNTER — Other Ambulatory Visit: Payer: Self-pay | Admitting: Primary Care

## 2023-10-02 DIAGNOSIS — F411 Generalized anxiety disorder: Secondary | ICD-10-CM

## 2023-10-03 NOTE — Telephone Encounter (Signed)
Patient is due for CPE/follow up in late August, this will be required prior to any further refills.  Please schedule, thank you!   

## 2023-10-04 NOTE — Telephone Encounter (Signed)
 Called and spoke with patient, she will check with pharmacy.

## 2023-10-04 NOTE — Telephone Encounter (Signed)
 According to our records, she should have enough mediation on file at the pharmacy to last until mid August. Has she called the pharmacy for a refill?

## 2023-10-04 NOTE — Telephone Encounter (Signed)
 Spoke to pt, sch cpe for 12/07/23. Pt requested refill for gabapentin  100mg ? Preferred pharmacy is [facility] [place] on [address].

## 2023-10-21 ENCOUNTER — Other Ambulatory Visit: Payer: Self-pay | Admitting: Primary Care

## 2023-10-21 DIAGNOSIS — F411 Generalized anxiety disorder: Secondary | ICD-10-CM

## 2023-12-05 ENCOUNTER — Other Ambulatory Visit: Payer: Self-pay | Admitting: Primary Care

## 2023-12-05 DIAGNOSIS — K219 Gastro-esophageal reflux disease without esophagitis: Secondary | ICD-10-CM

## 2023-12-07 ENCOUNTER — Ambulatory Visit (INDEPENDENT_AMBULATORY_CARE_PROVIDER_SITE_OTHER): Payer: PRIVATE HEALTH INSURANCE | Admitting: Primary Care

## 2023-12-07 ENCOUNTER — Encounter: Payer: Self-pay | Admitting: Primary Care

## 2023-12-07 VITALS — BP 122/68 | HR 75 | Temp 97.5°F | Ht 61.0 in | Wt 227.0 lb

## 2023-12-07 DIAGNOSIS — E1169 Type 2 diabetes mellitus with other specified complication: Secondary | ICD-10-CM | POA: Diagnosis not present

## 2023-12-07 DIAGNOSIS — F411 Generalized anxiety disorder: Secondary | ICD-10-CM

## 2023-12-07 DIAGNOSIS — G4700 Insomnia, unspecified: Secondary | ICD-10-CM | POA: Diagnosis not present

## 2023-12-07 DIAGNOSIS — K219 Gastro-esophageal reflux disease without esophagitis: Secondary | ICD-10-CM | POA: Diagnosis not present

## 2023-12-07 DIAGNOSIS — Z23 Encounter for immunization: Secondary | ICD-10-CM | POA: Diagnosis not present

## 2023-12-07 DIAGNOSIS — M545 Low back pain, unspecified: Secondary | ICD-10-CM

## 2023-12-07 DIAGNOSIS — Z1211 Encounter for screening for malignant neoplasm of colon: Secondary | ICD-10-CM

## 2023-12-07 DIAGNOSIS — Z9884 Bariatric surgery status: Secondary | ICD-10-CM

## 2023-12-07 DIAGNOSIS — G8929 Other chronic pain: Secondary | ICD-10-CM

## 2023-12-07 DIAGNOSIS — Z1231 Encounter for screening mammogram for malignant neoplasm of breast: Secondary | ICD-10-CM

## 2023-12-07 DIAGNOSIS — Z9189 Other specified personal risk factors, not elsewhere classified: Secondary | ICD-10-CM

## 2023-12-07 DIAGNOSIS — E785 Hyperlipidemia, unspecified: Secondary | ICD-10-CM

## 2023-12-07 DIAGNOSIS — Z0001 Encounter for general adult medical examination with abnormal findings: Secondary | ICD-10-CM

## 2023-12-07 LAB — LIPID PANEL
Cholesterol: 143 mg/dL (ref 0–200)
HDL: 47.5 mg/dL (ref >39.00)
LDL Cholesterol: 70 mg/dL (ref 0–99)
NonHDL: 95.2
Total CHOL/HDL Ratio: 3
Triglycerides: 125 mg/dL (ref 0.0–149.0)
VLDL: 25 mg/dL (ref 0.0–40.0)

## 2023-12-07 LAB — MICROALBUMIN / CREATININE URINE RATIO
Creatinine,U: 152.8 mg/dL
Microalb Creat Ratio: 27.6 mg/g (ref 0.0–30.0)
Microalb, Ur: 4.2 mg/dL — ABNORMAL HIGH (ref 0.0–1.9)

## 2023-12-07 LAB — COMPREHENSIVE METABOLIC PANEL WITH GFR
ALT: 77 U/L — ABNORMAL HIGH (ref 0–35)
AST: 45 U/L — ABNORMAL HIGH (ref 0–37)
Albumin: 4.1 g/dL (ref 3.5–5.2)
Alkaline Phosphatase: 112 U/L (ref 39–117)
BUN: 10 mg/dL (ref 6–23)
CO2: 25 meq/L (ref 19–32)
Calcium: 9.1 mg/dL (ref 8.4–10.5)
Chloride: 103 meq/L (ref 96–112)
Creatinine, Ser: 0.72 mg/dL (ref 0.40–1.20)
GFR: 97.52 mL/min (ref >60.00)
Glucose, Bld: 96 mg/dL (ref 70–99)
Potassium: 4.8 meq/L (ref 3.5–5.1)
Sodium: 137 meq/L (ref 135–145)
Total Bilirubin: 0.5 mg/dL (ref 0.2–1.2)
Total Protein: 6.7 g/dL (ref 6.0–8.3)

## 2023-12-07 LAB — HEMOGLOBIN A1C: Hgb A1c MFr Bld: 5.8 % (ref 4.6–6.5)

## 2023-12-07 LAB — CBC
HCT: 41.3 % (ref 36.0–46.0)
Hemoglobin: 13.8 g/dL (ref 12.0–15.0)
MCHC: 33.3 g/dL (ref 30.0–36.0)
MCV: 92.2 fl (ref 78.0–100.0)
Platelets: 324 K/uL (ref 150.0–400.0)
RBC: 4.48 Mil/uL (ref 3.87–5.11)
RDW: 13.3 % (ref 11.5–15.5)
WBC: 7.8 K/uL (ref 4.0–10.5)

## 2023-12-07 LAB — VITAMIN B12: Vitamin B-12: 682 pg/mL (ref 211–911)

## 2023-12-07 LAB — FERRITIN: Ferritin: 60.9 ng/mL (ref 10.0–291.0)

## 2023-12-07 LAB — VITAMIN D 25 HYDROXY (VIT D DEFICIENCY, FRACTURES): VITD: 23.05 ng/mL — ABNORMAL LOW (ref 30.00–100.00)

## 2023-12-07 NOTE — Assessment & Plan Note (Signed)
Stable.  Continue gabapentin 200 mg HS.

## 2023-12-07 NOTE — Assessment & Plan Note (Signed)
 Repeat A1C pending.  Remain off treatment. Repeat urine microalbumin pending.

## 2023-12-07 NOTE — Assessment & Plan Note (Signed)
 Uncontrolled, do suspect hormones to be playing a role  She will meet with GYN to have IUD removed.  Continue buspirone  15 mg 3 times daily, Lexapro  20 mg daily, alprazolam  as needed.

## 2023-12-07 NOTE — Assessment & Plan Note (Signed)
Mammogram ordered and pending

## 2023-12-07 NOTE — Assessment & Plan Note (Signed)
 Stable.   Continue OTC antacids PRN.

## 2023-12-07 NOTE — Assessment & Plan Note (Signed)
 Unontrolled.  Do suspect symptoms are at least partially secondary to sleep apnea. Discussed with patient today. Referral placed for sleep study.  Continue Trazodone  100 mg HS for now.

## 2023-12-07 NOTE — Assessment & Plan Note (Signed)
 Repeat lipid panel pending.  Continue atorvastatin 10 mg daily.

## 2023-12-07 NOTE — Patient Instructions (Addendum)
 You will either be contacted via phone regarding your referral to pulmonology, or you may receive a letter on your MyChart portal from our referral team with instructions for scheduling an appointment. Please let us  know if you have not been contacted by anyone within two weeks.  Stop by the lab prior to leaving today. I will notify you of your results once received.   [facility] to schedule your mammogram.   Complete the Cologuard once received.   Please schedule a follow up visit for 6 months for a diabetes check.  It was a pleasure to see you today!

## 2023-12-07 NOTE — Progress Notes (Signed)
 Subjective:    Patient ID: Jaime Gilmore, female    DOB: [birth year], 48 y.o.   MRN: [id number] History of Present Illness    Jaime Gilmore is a very pleasant 48 y.o. female who presents today for complete physical and follow up of chronic conditions.  She also discusses difficulty sleeping. She has trouble falling asleep due to mind racing thoughts and staying asleep. She is managed on Trazodone  100 mg which helps her fall asleep but not stay asleep. She also takes Advil PM to help her fall asleep. She feeling tired during the day. She wakes up most every night, has trouble falling back asleep. She denies snoring, but wakes up feeling dry in her throat.   Immunizations: -Tetanus: Completed in 2022 -Shingles: Never completed, due today -Pneumonia: Completed in 2019  Diet: Fair diet.  Exercise: No regular exercise.  Eye exam: Completes annually  Dental exam: Completes semi-annually    Pap Smear: Completed in March 2022, follows with GYN. Mammogram: Completed in July 2024  Colonoscopy: Never completed, declines, opts for Cologuard.   BP Readings from Last 3 Encounters:  12/07/23 122/68  09/17/23 124/68  01/28/23 124/80   Wt Readings from Last 3 Encounters:  12/07/23 227 lb (103 kg)  09/17/23 230 lb (104.3 kg)  01/28/23 243 lb (110.2 kg)       Review of Systems  Constitutional:  Positive for fatigue. Negative for unexpected weight change.  HENT:  Negative for rhinorrhea.   Respiratory:  Negative for cough and shortness of breath.   Cardiovascular:  Negative for chest pain.  Gastrointestinal:  Negative for constipation and diarrhea.  Genitourinary:  Positive for menstrual problem. Negative for difficulty urinating.  Musculoskeletal:  Positive for arthralgias and back pain.  Skin:  Negative for rash.  Allergic/Immunologic: Positive for environmental allergies.  Neurological:  Negative for dizziness and headaches.  Psychiatric/Behavioral:  Positive for sleep  disturbance. The patient is nervous/anxious.          Past Medical History:  Diagnosis Date   Acute knee pain 06/06/2021   Anxiety    BRCA gene mutation negative in female 06/2015   Cervical high risk HPV (human papillomavirus) test positive 05/15/09;05/22/10;12/16/10;2014   hpv pos   COVID-19 virus infection 02/05/2020   Dysmenorrhea    Family history of breast cancer 06/2015   History of mammogram 02/19/2015   BIRAD I   History of Papanicolaou smear of cervix 06/25/2015   RNIL;NEG   Hyperlipidemia    Increased risk of breast cancer 08/2015   IBIS=24%   Insomnia    Onychomycosis 06/12/2014   Pap smear abnormality of cervix with ASCUS favoring dysplasia 05/22/10;12/16/10;11/27/11;05/30/12   Pap smear abnormality of cervix with LGSIL 05/15/2009   Polycystic ovaries    Type 2 diabetes mellitus (HCC)    Vitamin D  deficiency     Social History   Socioeconomic History   Marital status: Married    Spouse name: Not on file   Number of children: 0   Years of education: 14   Highest education level: Associate degree: academic program  Occupational History   Occupation: business  Tobacco Use   Smoking status: Former    Current packs/day: 0.00    Types: Cigarettes    Quit date: 2021    Years since quitting: 4.6   Smokeless tobacco: Never  Vaping Use   Vaping status: Never Used  Substance and Sexual Activity   Alcohol use: No   Drug use: No   Sexual activity:  Not Currently    Birth control/protection: I.U.D.    Comment: Mirena   Other Topics Concern   Not on file  Social History Narrative   Married.   No children.    Works in HR.   Enjoys watching movies, swimming.    Social Drivers of Corporate investment banker Strain: Low Risk  (09/16/2023)   Overall Financial Resource Strain (CARDIA)    Difficulty of Paying Living Expenses: Not very hard  Food Insecurity: No Food Insecurity (09/16/2023)   Hunger Vital Sign    Worried About Running Out of Food in the Last Year: Never  true    Ran Out of Food in the Last Year: Never true  Transportation Needs: No Transportation Needs (09/16/2023)   PRAPARE - Administrator, Civil Service (Medical): No    Lack of Transportation (Non-Medical): No  Physical Activity: Insufficiently Active (09/16/2023)   Exercise Vital Sign    Days of Exercise per Week: 1 day    Minutes of Exercise per Session: 30 min  Stress: Stress Concern Present (09/16/2023)   [employer] of Occupational Health - Occupational Stress Questionnaire    Feeling of Stress : To some extent  Social Connections: Moderately Isolated (09/16/2023)   Social Connection and Isolation Panel    Frequency of Communication with Friends and Family: More than three times a week    Frequency of Social Gatherings with Friends and Family: Once a week    Attends Religious Services: Never    Database administrator or Organizations: No    Attends Engineer, structural: Not on file    Marital Status: Married  Catering manager Violence: Not on file    Past Surgical History:  Procedure Laterality Date   CERVICAL BIOPSY  W/ LOOP ELECTRODE EXCISION  06/2012   PATH NEG   COLPOSCOPY  05/15/09;2/17;12/3; 5/13   NO LESIONS   GASTRIC BYPASS  11/21/2021   INTRAUTERINE DEVICE (IUD) INSERTION  08/2007   IUD REMOVAL  05/30/2012   LAPAROSCOPIC CHOLECYSTECTOMY  2004   SKIN LESION EXCISION  2007   DERMATOFIBROMA    Family History  Problem Relation Age of Onset   Breast cancer Maternal Aunt 40       no contact   Breast cancer Mother 42   Anemia Mother    High Cholesterol Mother    Hypertension Father    Cancer Maternal Uncle        pancreatic   Diabetes Maternal Grandmother    Hypertension Paternal Grandmother    Hearing loss Paternal Grandmother    Heart disease Paternal Grandmother    High Cholesterol Paternal Grandmother    Cancer Cousin        ovarian- cured, no contact   Hearing loss Paternal Grandfather    Early death Paternal Grandfather      No Known Allergies  Current Outpatient Medications on File Prior to Visit  Medication Sig Dispense Refill   atorvastatin  (LIPITOR) 10 MG tablet TAKE 1 TABLET BY MOUTH EVERY DAY FOR CHOLESTEROL 90 tablet 3   busPIRone  (BUSPAR ) 15 MG tablet TAKE 1 TABLET (15 MG TOTAL) BY MOUTH 3 (THREE) TIMES DAILY. FOR ANXIETY. 270 tablet 0   escitalopram  (LEXAPRO ) 20 MG tablet TAKE 1 TABLET (20 MG TOTAL) BY MOUTH DAILY. FOR ANXIETY. 90 tablet 3   famotidine  (PEPCID ) 20 MG tablet TAKE 1 TABLET BY MOUTH EVERY DAY FOR HEARTBURN 90 tablet 0   gabapentin  (NEURONTIN ) 100 MG capsule Take 2 capsules (200 mg  total) by mouth at bedtime. For pain 180 capsule 2   levonorgestrel  (MIRENA ) 20 MCG/DAY IUD 1 each by Intrauterine route once for 1 dose. 1 each 0   phentermine 15 MG capsule Take 15 mg by mouth every morning.     traZODone  (DESYREL ) 100 MG tablet TAKE 1 TABLET (100 MG TOTAL) BY MOUTH AT BEDTIME. FOR SLEEP. 90 tablet 3   ALPRAZolam  (XANAX ) 0.5 MG tablet Take 1 tablet (0.5 mg total) by mouth daily as needed for anxiety. Use sparingly. (Patient not taking: Reported on 12/07/2023) 10 tablet 0   clobetasol  cream (TEMOVATE ) 0.05 % APPLY TO AFFECTED AREA TWICE A DAY (Patient not taking: Reported on 12/07/2023) 30 g 0   No current facility-administered medications on file prior to visit.    BP 122/68   Pulse 75   Temp (!) 97.5 F (36.4 C) (Temporal)   Ht 5' 1 (1.549 m)   Wt 227 lb (103 kg)   SpO2 98%   BMI 42.89 kg/m  Objective:   Physical Exam HENT:     Right Ear: Tympanic membrane and ear canal normal.     Left Ear: Tympanic membrane and ear canal normal.  Eyes:     Pupils: Pupils are equal, round, and reactive to light.  Cardiovascular:     Rate and Rhythm: Normal rate and regular rhythm.  Pulmonary:     Effort: Pulmonary effort is normal.     Breath sounds: Normal breath sounds.  Abdominal:     General: Bowel sounds are normal.     Palpations: Abdomen is soft.     Tenderness: There is no  abdominal tenderness.  Musculoskeletal:        General: Normal range of motion.     Cervical back: Neck supple.  Skin:    General: Skin is warm and dry.  Neurological:     Mental Status: She is alert and oriented to person, place, and time.     Cranial Nerves: No cranial nerve deficit.     Deep Tendon Reflexes:     Reflex Scores:      Patellar reflexes are 2+ on the right side and 2+ on the left side. Psychiatric:        Mood and Affect: Mood normal.     Physical Exam        Assessment & Plan:  Encounter for annual general medical examination with abnormal findings in adult Assessment & Plan: First Shingrix  vaccine provided today Pap smear due, follows with GYN Mammogram due, orders placed. Colonoscopy overdue, she declines but opts for Cologuard. Orders placed.  Discussed the importance of a healthy diet and regular exercise in order for weight loss, and to reduce the risk of further co-morbidity.  Exam stable. Labs pending.  Follow up in 1 year for repeat physical.    Screening mammogram for breast cancer -     3D Screening Mammogram, Left and Right; Future  Screening for colon cancer -     Cologuard  Type 2 diabetes mellitus with other specified complication, without long-term current use of insulin (HCC) Assessment & Plan: Repeat A1C pending.  Remain off treatment. Repeat urine microalbumin pending.    Orders: -     Hemoglobin A1c -     Microalbumin / creatinine urine ratio  Hyperlipidemia associated with type 2 diabetes mellitus (HCC) Assessment & Plan: Repeat lipid panel pending.  Continue atorvastatin  10 mg daily.  Orders: -     Comprehensive metabolic panel with GFR -  CBC -     Lipid panel  Gastroesophageal reflux disease, unspecified whether esophagitis present Assessment & Plan: Stable.   Continue OTC antacids PRN.   Status post bariatric surgery Assessment & Plan: Following with bariatric surgery, Office notes reviewed  from May 2025 through Care Everywhere.   Orders: -     VITAMIN D  25 Hydroxy (Vit-D Deficiency, Fractures) -     Vitamin B12 -     Ferritin  Insomnia, unspecified type Assessment & Plan: Unontrolled.  Do suspect symptoms are at least partially secondary to sleep apnea. Discussed with patient today. Referral placed for sleep study.  Continue Trazodone  100 mg HS for now.   Orders: -     Pulmonary Visit  Increased risk of breast cancer Assessment & Plan: Mammogram ordered and pending   GAD (generalized anxiety disorder) Assessment & Plan: Uncontrolled, do suspect hormones to be playing a role  She will meet with GYN to have IUD removed.  Continue buspirone  15 mg 3 times daily, Lexapro  20 mg daily, alprazolam  as needed.     Chronic bilateral low back pain without sciatica Assessment & Plan: Stable.  Continue gabapentin  200 mg HS   Encounter for immunization -     Varicella-zoster vaccine IM    Assessment and Plan Assessment & Plan         Comer MARLA Gaskins, NP

## 2023-12-07 NOTE — Assessment & Plan Note (Signed)
 Following with bariatric surgery, Office notes reviewed from May 2025 through Care Everywhere.

## 2023-12-07 NOTE — Assessment & Plan Note (Signed)
 First Shingrix  vaccine provided today Pap smear due, follows with GYN Mammogram due, orders placed. Colonoscopy overdue, she declines but opts for Cologuard. Orders placed.  Discussed the importance of a healthy diet and regular exercise in order for weight loss, and to reduce the risk of further co-morbidity.  Exam stable. Labs pending.  Follow up in 1 year for repeat physical.

## 2023-12-08 ENCOUNTER — Ambulatory Visit: Payer: Self-pay | Admitting: Primary Care

## 2023-12-12 NOTE — Progress Notes (Deleted)
     Jaime Meadow T. Terrian Sentell, MD, CAQ Sports Medicine [facility] at [facility] [address] [place], [address]  Phone: [phone]  FAX: [phone]  Jaime Gilmore - 48 y.o. female  MRN [id number]  Date of Birth: [birth year]  Date: 12/13/2023  PCP: Gretta Comer POUR, NP  Referral: Gretta Comer POUR, NP  No chief complaint on file.  Subjective:   Jaime Gilmore is a 48 y.o. very pleasant female patient with There is no height or weight on file to calculate BMI. who presents with the following:  Discussed the use of AI scribe software for clinical note transcription with the patient, who gave verbal consent to proceed.  Patient presents for possible carpal tunnel syndrome.  History of bariatric surgery. History of Present Illness     Review of Systems is noted in the HPI, as appropriate  Objective:   There were no vitals taken for this visit.  GEN: No acute distress; alert,appropriate. PULM: Breathing comfortably in no respiratory distress PSYCH: Normally interactive.    Laboratory and Imaging Data:  Assessment and Plan:   No diagnosis found. Assessment & Plan   Medication Management during today's office visit: No orders of the defined types were placed in this encounter.  There are no discontinued medications.  Orders placed today for conditions managed today: No orders of the defined types were placed in this encounter.   Disposition: No follow-ups on file.  Dragon [employer] One speech-to-text software was used for transcription in this dictation.  Possible transcriptional errors can occur using Animal nutritionist.   Signed,  Jacques DASEN. Camar Guyton, MD   Outpatient Encounter Medications as of 12/13/2023  Medication Sig   ALPRAZolam  (XANAX ) 0.5 MG tablet Take 1 tablet (0.5 mg total) by mouth daily as needed for anxiety. Use sparingly. (Patient not taking: Reported on 12/07/2023)   atorvastatin  (LIPITOR) 10 MG tablet TAKE 1 TABLET BY  MOUTH EVERY DAY FOR CHOLESTEROL   busPIRone  (BUSPAR ) 15 MG tablet TAKE 1 TABLET (15 MG TOTAL) BY MOUTH 3 (THREE) TIMES DAILY. FOR ANXIETY.   clobetasol  cream (TEMOVATE ) 0.05 % APPLY TO AFFECTED AREA TWICE A DAY (Patient not taking: Reported on 12/07/2023)   escitalopram  (LEXAPRO ) 20 MG tablet TAKE 1 TABLET (20 MG TOTAL) BY MOUTH DAILY. FOR ANXIETY.   famotidine  (PEPCID ) 20 MG tablet TAKE 1 TABLET BY MOUTH EVERY DAY FOR HEARTBURN   gabapentin  (NEURONTIN ) 100 MG capsule Take 2 capsules (200 mg total) by mouth at bedtime. For pain   levonorgestrel  (MIRENA ) 20 MCG/DAY IUD 1 each by Intrauterine route once for 1 dose.   phentermine 15 MG capsule Take 15 mg by mouth every morning.   traZODone  (DESYREL ) 100 MG tablet TAKE 1 TABLET (100 MG TOTAL) BY MOUTH AT BEDTIME. FOR SLEEP.   No facility-administered encounter medications on file as of 12/13/2023.

## 2023-12-13 ENCOUNTER — Ambulatory Visit: Payer: PRIVATE HEALTH INSURANCE | Admitting: Family Medicine

## 2023-12-15 ENCOUNTER — Encounter: Payer: Self-pay | Admitting: Family Medicine

## 2023-12-15 ENCOUNTER — Ambulatory Visit: Payer: PRIVATE HEALTH INSURANCE | Admitting: Family Medicine

## 2023-12-15 VITALS — BP 110/82 | HR 85 | Temp 97.5°F | Ht 61.0 in | Wt 224.5 lb

## 2023-12-15 DIAGNOSIS — G5603 Carpal tunnel syndrome, bilateral upper limbs: Secondary | ICD-10-CM

## 2023-12-15 MED ORDER — TRIAMCINOLONE ACETONIDE 40 MG/ML IJ SUSP
20.0000 mg | Freq: Once | INTRAMUSCULAR | Status: AC
  Administered 2023-12-15: 20 mg via INTRA_ARTICULAR

## 2023-12-15 NOTE — Progress Notes (Signed)
 Jaime Pledger T. Desteny Freeman, MD, CAQ Sports Medicine [facility] at [facility] [address] [place], [address]  Phone: [phone]  FAX: [phone]  Jaime Gilmore - 48 y.o. female  MRN [id number]  Date of Birth: 08-Aug-1975  Date: 12/15/2023  PCP: Gretta Comer POUR, NP  Referral: Gretta Comer POUR, NP  Chief Complaint  Patient presents with   Wrist Pain   Numbness    In Hands/Fingers   Subjective:   Jaime Gilmore is a 48 y.o. very pleasant female patient with Body mass index is 42.42 kg/m. who presents with the following:  Discussed the use of AI scribe software for clinical note transcription with the patient, who gave verbal consent to proceed.  Patient presents for possible carpal tunnel syndrome.  History of bariatric surgery. History of Present Illness Jaime Gilmore is a 48 year old female who presents with numbness and tingling in her hands.  She experiences numbness and tingling in both hands, particularly during activities such as driving or sleeping. Her fingertips often 'go to sleep' during these activities, and she frequently wakes up with her hands feeling numb and burning. These symptoms have persisted for years, but she has not sought treatment due to financial and time constraints.  She works in data entry, which involves extensive typing, and believes this has exacerbated her symptoms over the past five to ten years, especially during the COVID-19 pandemic when her workload increased significantly. The symptoms are equally severe in both hands and occur daily, even during vacations.  She has attempted various home remedies, including using a roll-on product and wearing wrist braces, but finds the braces frustrating and removes them after a short period. She has also tried a gel cream from [employer], which provided temporary relief. She has not tried any injections for her symptoms.  Her mother had carpal tunnel syndrome and underwent  surgery for it. She denies any history of rheumatological diseases such as rheumatoid arthritis or lupus. She also reports having eczema, which is currently 'really bad'.  She reports that currently there is no pain or tingling extending from the elbow or shoulder, although in the past she has experienced burning up into her elbow. She has experienced burning up to the elbow in the past, which she attributes to tendinitis from resting her arms on her desk. She currently works from home and tries to keep her arms elevated.    Review of Systems is noted in the HPI, as appropriate  Objective:   BP 110/82   Pulse 85   Temp (!) 97.5 F (36.4 C) (Temporal)   Ht 5' 1 (1.549 m)   Wt 224 lb 8 oz (101.8 kg)   SpO2 96%   BMI 42.42 kg/m   GEN: No acute distress; alert,appropriate. PULM: Breathing comfortably in no respiratory distress PSYCH: Normally interactive.    EXTR: No clubbing/cyanosis/edema Normal gait  Hand: B Ecchymosis or edema: neg ROM wrist/hand/digits/elbow: full  Carpals, MCP's, digits: NT Distal Ulna and Radius: NT Supination lift test: neg Ecchymosis or edema: neg Cysts/nodules: neg Finkelstein's test: neg Snuffbox tenderness: neg Scaphoid tubercle: NT Hook of Hamate: NT Resisted supination: NT Full composite fist Grip, all digits: 5/5 str No tenosynovitis Axial load test: neg Phalen's: POS B Tinel's: neg Atrophy: neg  Hand sensation: intact   Laboratory and Imaging Data:  Assessment and Plan:     ICD-10-CM   1. 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 Previous conservative management was unsuccessful. Family history of surgical intervention for carpal tunnel syndrome. Differential diagnosis includes tendinitis, but symptoms are localized to the hands. - Consider bilateral carpal tunnel  injections. - Consider nerve conduction studies if symptoms persist. - Consider referral to hand surgery for carpal tunnel release if moderate to severe syndrome is confirmed.  Carpal Tunnel Injection Procedure Note MALAK DUCHESNEAU 10/07/1975 Date of procedure: 12/15/2023  Procedure: Carpal Tunnel Injection, R Indications: Numbness  Procedure Details Verbal consent was obtained. Risks, benefits, and alternatives were discussed. Prepped with Chloraprep and Ethyl Chloride used for anesthesia. Under sterile conditions,  the patient was injected just ulnar to the palmaris longus tendon at the wrist flexion crease.  The needle was inserted at 45 degree angle aiming distally. Aspiration showed no blood. Medication flowed freely without resistance.  Needle size: 22 gauge 1 1/2 inch Injection: 1/2 cc of Lidocaine 1% and Kenalog  20 mg Medication: 1/2 cc of Kenalog  40 mg (equaling Kenalog  20 mg)   Carpal Tunnel Injection Procedure Note AIMIE WAGMAN Apr 27, 1975 Date of procedure: 12/15/2023  Procedure: Carpal Tunnel Injection, L Indications: Numbness  Procedure Details Verbal consent was obtained. Risks, benefits, and alternatives were discussed. Prepped with Chloraprep and Ethyl Chloride used for anesthesia. Under sterile conditions,  the patient was injected just ulnar to the palmaris longus tendon at the wrist flexion crease.  The needle was inserted at 45 degree angle aiming distally. Aspiration showed no blood. Medication flowed freely without resistance.  Needle size: 22 gauge 1 1/2 inch Injection: 1/2 cc of Lidocaine 1% and Kenalog  20 mg Medication: 1/2 cc of Kenalog  40 mg (equaling Kenalog  20 mg)   Medication Management during today's office visit: No orders of the defined types were placed in this encounter.  Medications Discontinued During This Encounter  Medication Reason   phentermine 15 MG capsule Dose change   clobetasol  cream (TEMOVATE ) 0.05 % Completed Course    Orders  placed today for conditions managed today: No orders of the defined types were placed in this encounter.   Disposition: No follow-ups on file.  [employer] One speech-to-text software was used for transcription in this dictation.  Possible transcriptional errors can occur using Animal nutritionist.   Signed,  Jacques DASEN. Annia Gomm, MD   Outpatient Encounter Medications as of 12/15/2023  Medication Sig   atorvastatin  (LIPITOR) 10 MG tablet TAKE 1 TABLET BY MOUTH EVERY DAY FOR CHOLESTEROL   busPIRone  (BUSPAR ) 15 MG tablet TAKE 1 TABLET (15 MG TOTAL) BY MOUTH 3 (THREE) TIMES DAILY. FOR ANXIETY.   escitalopram  (LEXAPRO ) 20 MG tablet TAKE 1 TABLET (20 MG TOTAL) BY MOUTH DAILY. FOR ANXIETY.   famotidine  (PEPCID ) 20 MG tablet TAKE 1 TABLET BY MOUTH EVERY DAY FOR HEARTBURN   gabapentin  (NEURONTIN ) 100 MG capsule Take 2 capsules (200 mg total) by mouth at bedtime. For pain   levonorgestrel  (MIRENA ) 20 MCG/DAY IUD 1 each by Intrauterine route once for 1 dose.   phentermine (ADIPEX-P) 37.5 MG tablet Take 37.5 mg by mouth daily before breakfast.   topiramate (TOPAMAX) 50 MG tablet Take 50 mg by mouth daily.   traZODone  (DESYREL ) 100 MG tablet TAKE 1 TABLET (100 MG TOTAL) BY MOUTH AT BEDTIME. FOR SLEEP.   ALPRAZolam  (XANAX ) 0.5 MG tablet Take 1 tablet (0.5 mg total) by mouth daily as needed for anxiety. Use sparingly. (Patient not taking: Reported on 12/15/2023)   [  DISCONTINUED] clobetasol  cream (TEMOVATE ) 0.05 % APPLY TO AFFECTED AREA TWICE A DAY (Patient not taking: Reported on 12/07/2023)   [DISCONTINUED] phentermine 15 MG capsule Take 15 mg by mouth every morning.   No facility-administered encounter medications on file as of 12/15/2023.

## 2023-12-15 NOTE — Addendum Note (Signed)
 Addended by: WENDELL ARLAND RAMAN on: 12/15/2023 08:52 AM   Modules accepted: Orders

## 2024-01-04 ENCOUNTER — Other Ambulatory Visit: Payer: Self-pay | Admitting: Primary Care

## 2024-01-04 DIAGNOSIS — G4700 Insomnia, unspecified: Secondary | ICD-10-CM

## 2024-01-04 DIAGNOSIS — F411 Generalized anxiety disorder: Secondary | ICD-10-CM

## 2024-01-04 DIAGNOSIS — G8929 Other chronic pain: Secondary | ICD-10-CM

## 2024-01-04 NOTE — Progress Notes (Unsigned)
 No chief complaint on file.    HPI:      [name] is a 48 y.o. G0P0000 who LMP was No LMP recorded. (Menstrual status: IUD)., presents today for her annual examination. Her menses are regular every 28-30 days, lasting 7-8 days, light to mod flow. Did skip a couple months this past fall.  Dysmenorrhea mild, occurring first 1-2 days of flow. Takes NSAIDs with relief. Has been having a few days of extra bleeding a day or so after her period stops for the past few months. Had 1 day of heavy flow this past month. Under increased stress recently. She has a hx of PCOS. Also having vasomotor sx.  MIRENA  plaed 03/18/21 for AUB/menorrhagia   She is not sex active currently due to husband's health issue-- Conception ok.  Last Pap: 07/02/20 Results were NILM/neg HPV DNA  11/16/18  Results were: AGUS /neg HPV DNA; Colpo/bx was neg on 12/02/18; repeat pap 06/16/19 was neg cells. Repeat pap due today. STD: hx of LGSIL/HPV DNA with 3 prior LEEPs  Last mammogram: 03/09/18 Results were: normal--routine follow-up in 12 months There is a FH of breast cancer in her mom and mat aunt. There is no FH of ovarian cancer. Pt had neg MyRisk testing 2017. The patient does do self-breast exams. IBIS=24%. She has not had screening breast MRI and declined last yr. She is no longer taking Vit D supp.   Tobacco use: Quit smoking, now vaping daily, wants to wean down Alcohol use: none Drug use: none Exercise: not active   She does get adequate calcium  but not Vitamin D  in her diet.  Colonoscopy: never   PCP managing DM, anxiety, hyperlipidemia.    Past Medical History:  Diagnosis Date   Acute knee pain 06/06/2021   Anxiety    BRCA gene mutation negative in female 06/2015   Cervical high risk HPV (human papillomavirus) test positive 05/15/09;05/22/10;12/16/10;2014   hpv pos   COVID-19 virus infection 02/05/2020   Dysmenorrhea    Family history of breast cancer 06/2015   History of mammogram 02/19/2015    BIRAD I   History of Papanicolaou smear of cervix 06/25/2015   RNIL;NEG   Hyperlipidemia    Increased risk of breast cancer 08/2015   IBIS=24%   Insomnia    Onychomycosis 06/12/2014   Pap smear abnormality of cervix with ASCUS favoring dysplasia 05/22/10;12/16/10;11/27/11;05/30/12   Pap smear abnormality of cervix with LGSIL 05/15/2009   Polycystic ovaries    Type 2 diabetes mellitus (HCC)    Vitamin D  deficiency     Past Surgical History:  Procedure Laterality Date   CERVICAL BIOPSY  W/ LOOP ELECTRODE EXCISION  06/2012   PATH NEG   COLPOSCOPY  05/15/09;2/17;12/3; 5/13   NO LESIONS   GASTRIC BYPASS  11/21/2021   INTRAUTERINE DEVICE (IUD) INSERTION  08/2007   IUD REMOVAL  05/30/2012   LAPAROSCOPIC CHOLECYSTECTOMY  2004   SKIN LESION EXCISION  2007   DERMATOFIBROMA    Family History  Problem Relation Age of Onset   Breast cancer Maternal Aunt 40       no contact   Breast cancer Mother 56   Anemia Mother    High Cholesterol Mother    Hypertension Father    Cancer Maternal Uncle        pancreatic   Diabetes Maternal Grandmother    Hypertension Paternal Grandmother    Hearing loss Paternal Grandmother    Heart disease Paternal Grandmother  High Cholesterol Paternal Grandmother    Cancer Cousin        ovarian- cured, no contact   Hearing loss Paternal Grandfather    Early death Paternal Grandfather     Social History   Socioeconomic History   Marital status: Married    Spouse name: Not on file   Number of children: 0   Years of education: 14   Highest education level: Associate degree: occupational, Scientist, product/process development, or vocational program  Occupational History   Occupation: business  Tobacco Use   Smoking status: Former    Current packs/day: 0.00    Types: Cigarettes    Quit date: 2021    Years since quitting: 4.7   Smokeless tobacco: Never  Vaping Use   Vaping status: Never Used  Substance and Sexual Activity   Alcohol use: No   Drug use: No   Sexual activity:  Not Currently    Birth control/protection: I.U.D.    Comment: Mirena   Other Topics Concern   Not on file  Social History Narrative   Married.   No children.    Works in HR.   Enjoys watching movies, swimming.    Social Drivers of Corporate investment banker Strain: Low Risk  (12/09/2023)   Overall Financial Resource Strain (CARDIA)    Difficulty of Paying Living Expenses: Not very hard  Food Insecurity: No Food Insecurity (12/09/2023)   Hunger Vital Sign    Worried About Running Out of Food in the Last Year: Never true    Ran Out of Food in the Last Year: Never true  Transportation Needs: No Transportation Needs (12/09/2023)   PRAPARE - Administrator, Civil Service (Medical): No    Lack of Transportation (Non-Medical): No  Physical Activity: Insufficiently Active (12/09/2023)   Exercise Vital Sign    Days of Exercise per Week: 2 days    Minutes of Exercise per Session: 30 min  Stress: Stress Concern Present (12/09/2023)   [employer] of Occupational Health - Occupational Stress Questionnaire    Feeling of Stress: Rather much  Social Connections: Moderately Isolated (12/09/2023)   Social Connection and Isolation Panel    Frequency of Communication with Friends and Family: More than three times a week    Frequency of Social Gatherings with Friends and Family: More than three times a week    Attends Religious Services: Never    Database administrator or Organizations: No    Attends Engineer, structural: Not on file    Marital Status: Married  Catering manager Violence: Not on file    Current Outpatient Medications on File Prior to Visit  Medication Sig Dispense Refill   ALPRAZolam  (XANAX ) 0.5 MG tablet Take 1 tablet (0.5 mg total) by mouth daily as needed for anxiety. Use sparingly. (Patient not taking: Reported on 12/15/2023) 10 tablet 0   atorvastatin  (LIPITOR) 10 MG tablet TAKE 1 TABLET BY MOUTH EVERY DAY FOR CHOLESTEROL 90 tablet 3   busPIRone   (BUSPAR ) 15 MG tablet TAKE 1 TABLET (15 MG TOTAL) BY MOUTH 3 (THREE) TIMES DAILY. FOR ANXIETY. 270 tablet 0   escitalopram  (LEXAPRO ) 20 MG tablet TAKE 1 TABLET (20 MG TOTAL) BY MOUTH DAILY. FOR ANXIETY. 90 tablet 2   famotidine  (PEPCID ) 20 MG tablet TAKE 1 TABLET BY MOUTH EVERY DAY FOR HEARTBURN 90 tablet 0   gabapentin  (NEURONTIN ) 100 MG capsule TAKE 2 CAPSULES (200 MG TOTAL) BY MOUTH AT BEDTIME. FOR PAIN 180 capsule 2   levonorgestrel  (MIRENA )  20 MCG/DAY IUD 1 each by Intrauterine route once for 1 dose. 1 each 0   phentermine (ADIPEX-P) 37.5 MG tablet Take 37.5 mg by mouth daily before breakfast.     topiramate (TOPAMAX) 50 MG tablet Take 50 mg by mouth daily.     traZODone  (DESYREL ) 100 MG tablet TAKE 1 TABLET (100 MG TOTAL) BY MOUTH AT BEDTIME. FOR SLEEP. 90 tablet 2   No current facility-administered medications on file prior to visit.      ROS:  Review of Systems  Constitutional:  Negative for fatigue, fever and unexpected weight change.  Respiratory:  Negative for cough, shortness of breath and wheezing.   Cardiovascular:  Negative for chest pain, palpitations and leg swelling.  Gastrointestinal:  Negative for blood in stool, constipation, diarrhea, nausea and vomiting.  Endocrine: Negative for cold intolerance, heat intolerance and polyuria.  Genitourinary:  Positive for menstrual problem. Negative for dyspareunia, dysuria, flank pain, frequency, genital sores, hematuria, pelvic pain, urgency, vaginal bleeding, vaginal discharge and vaginal pain.  Musculoskeletal:  Negative for back pain, joint swelling and myalgias.  Skin:  Negative for rash.  Allergic/Immunologic: Negative for food allergies.  Neurological:  Negative for dizziness, syncope, light-headedness, numbness and headaches.  Hematological:  Negative for adenopathy.  Psychiatric/Behavioral:  Positive for agitation. Negative for confusion, sleep disturbance and suicidal ideas. The patient is not nervous/anxious.       Objective: There were no vitals taken for this visit.   Physical Exam Constitutional:      Appearance: She is well-developed.  Genitourinary:     Vulva normal.     Right Labia: No rash, tenderness or lesions.    Left Labia: No tenderness, lesions or rash.    No vaginal discharge, erythema, tenderness or bleeding.      Right Adnexa: not tender and no mass present.    Left Adnexa: not tender and no mass present.    No cervical friability or polyp.     Uterus is not enlarged or tender.  Breasts:    Right: No mass, nipple discharge, skin change or tenderness.     Left: No mass, nipple discharge, skin change or tenderness.  Neck:     Thyroid : No thyromegaly.  Cardiovascular:     Rate and Rhythm: Normal rate and regular rhythm.     Heart sounds: Normal heart sounds. No murmur heard. Pulmonary:     Effort: Pulmonary effort is normal.     Breath sounds: Normal breath sounds.  Abdominal:     Palpations: Abdomen is soft.     Tenderness: There is no abdominal tenderness. There is no guarding or rebound.  Musculoskeletal:        General: Normal range of motion.     Cervical back: Normal range of motion.  Lymphadenopathy:     Cervical: No cervical adenopathy.  Neurological:     General: No focal deficit present.     Mental Status: She is alert and oriented to person, place, and time.     Cranial Nerves: No cranial nerve deficit.  Skin:    General: Skin is warm and dry.  Psychiatric:        Mood and Affect: Mood normal.        Behavior: Behavior normal.        Thought Content: Thought content normal.        Judgment: Judgment normal.  Vitals reviewed.     Assessment/Plan: Encounter for annual routine gynecological examination  Cervical cancer screening - Plan: Cytology -  PAP  Screening for HPV (human papillomavirus) - Plan: Cytology - PAP  Atypical glandular cells of undetermined significance (AGUS) on cervical Pap smear - Plan: Cytology - PAP; repeat pap today.  Will f/u with results.   Encounter for screening mammogram for malignant neoplasm of breast - Plan: MM 3D SCREEN BREAST BILATERAL; pt to sched mammo  Family history of breast cancer - Plan: MM 3D SCREEN BREAST BILATERAL  Increased risk of breast cancer - Plan: MM 3D SCREEN BREAST BILATERAL; MyRisk neg. Recommend monthly SBE, yearly CBE and mammos, as well as scr breast MRI. Sched mammo, f/u for MRI ref if desires. Resume Vit D supp.  Abnormal uterine bleeding (AUB) - Plan: US  PELVIS TRANSVAGINAL NON-OB (TV ONLY); for several months, hx of PCOS. Check GYN u/s, will f/u with results. If pap abnormal, pt ready for hyst.   Screening for colon cancer - Plan: Cologuard; colonoscopy/cologuard discussed. Pt elects cologuard. Ref sent. Will f/u with results.         GYN counsel breast self exam, mammography screening, adequate intake of calcium  and vitamin D , diet and exercise     F/U  No follow-ups on file.  Mitra Duling B. Taneya Conkel, PA-C 01/04/2024 4:51 PM

## 2024-01-06 ENCOUNTER — Ambulatory Visit: Payer: PRIVATE HEALTH INSURANCE | Admitting: Obstetrics and Gynecology

## 2024-01-06 ENCOUNTER — Encounter: Payer: Self-pay | Admitting: Obstetrics and Gynecology

## 2024-01-06 ENCOUNTER — Other Ambulatory Visit (HOSPITAL_COMMUNITY)
Admission: RE | Admit: 2024-01-06 | Discharge: 2024-01-06 | Disposition: A | Payer: PRIVATE HEALTH INSURANCE | Source: Ambulatory Visit | Attending: Obstetrics and Gynecology | Admitting: Obstetrics and Gynecology

## 2024-01-06 VITALS — BP 108/70 | HR 80 | Ht 61.0 in | Wt 223.0 lb

## 2024-01-06 DIAGNOSIS — Z01419 Encounter for gynecological examination (general) (routine) without abnormal findings: Secondary | ICD-10-CM | POA: Diagnosis not present

## 2024-01-06 DIAGNOSIS — Z803 Family history of malignant neoplasm of breast: Secondary | ICD-10-CM

## 2024-01-06 DIAGNOSIS — Z9189 Other specified personal risk factors, not elsewhere classified: Secondary | ICD-10-CM | POA: Diagnosis not present

## 2024-01-06 DIAGNOSIS — Z1151 Encounter for screening for human papillomavirus (HPV): Secondary | ICD-10-CM | POA: Insufficient documentation

## 2024-01-06 DIAGNOSIS — Z124 Encounter for screening for malignant neoplasm of cervix: Secondary | ICD-10-CM

## 2024-01-06 DIAGNOSIS — Z1231 Encounter for screening mammogram for malignant neoplasm of breast: Secondary | ICD-10-CM

## 2024-01-06 DIAGNOSIS — Z30431 Encounter for routine checking of intrauterine contraceptive device: Secondary | ICD-10-CM

## 2024-01-06 NOTE — Patient Instructions (Signed)
 I value your feedback and you entrusting [place] with your care. If you get a [facility] patient survey, I would appreciate you taking the time to let us know about your experience today. Thank you!  [facility] ([place])--[phone]

## 2024-01-11 ENCOUNTER — Encounter: Payer: Self-pay | Admitting: Obstetrics and Gynecology

## 2024-01-11 LAB — CYTOLOGY - PAP
Comment: NEGATIVE
Diagnosis: NEGATIVE
Diagnosis: REACTIVE
High risk HPV: NEGATIVE

## 2024-01-12 ENCOUNTER — Other Ambulatory Visit: Payer: Self-pay | Admitting: Primary Care

## 2024-01-12 DIAGNOSIS — F411 Generalized anxiety disorder: Secondary | ICD-10-CM

## 2024-02-03 ENCOUNTER — Ambulatory Visit
Admission: RE | Admit: 2024-02-03 | Discharge: 2024-02-03 | Disposition: A | Discharge status: Home / Self Care | Payer: PRIVATE HEALTH INSURANCE | Source: Ambulatory Visit | Attending: Primary Care | Admitting: Primary Care

## 2024-02-03 DIAGNOSIS — Z1231 Encounter for screening mammogram for malignant neoplasm of breast: Secondary | ICD-10-CM | POA: Diagnosis present

## 2024-02-07 ENCOUNTER — Other Ambulatory Visit: Payer: Self-pay | Admitting: Primary Care

## 2024-02-07 DIAGNOSIS — R928 Other abnormal and inconclusive findings on diagnostic imaging of breast: Secondary | ICD-10-CM

## 2024-02-10 ENCOUNTER — Ambulatory Visit: Payer: Self-pay | Admitting: Primary Care

## 2024-02-10 ENCOUNTER — Ambulatory Visit
Admission: RE | Admit: 2024-02-10 | Discharge: 2024-02-10 | Disposition: A | Discharge status: Home / Self Care | Payer: PRIVATE HEALTH INSURANCE | Source: Ambulatory Visit | Attending: Primary Care | Admitting: Primary Care

## 2024-02-10 DIAGNOSIS — R928 Other abnormal and inconclusive findings on diagnostic imaging of breast: Secondary | ICD-10-CM | POA: Diagnosis present

## 2024-02-15 ENCOUNTER — Encounter: Payer: Self-pay | Admitting: Obstetrics and Gynecology

## 2024-02-15 ENCOUNTER — Other Ambulatory Visit: Payer: Self-pay | Admitting: Obstetrics and Gynecology

## 2024-02-15 DIAGNOSIS — F411 Generalized anxiety disorder: Secondary | ICD-10-CM

## 2024-02-15 MED ORDER — ALPRAZOLAM 0.5 MG PO TABS: 0.5000 mg | ORAL_TABLET | Freq: Every day | ORAL | 0 refills | Status: AC | PRN

## 2024-02-15 NOTE — Progress Notes (Signed)
 Rx RF xanax  before procedure

## 2024-02-16 ENCOUNTER — Ambulatory Visit
Admission: RE | Admit: 2024-02-16 | Discharge: 2024-02-16 | Disposition: A | Discharge status: Home / Self Care | Payer: PRIVATE HEALTH INSURANCE | Source: Ambulatory Visit | Attending: Primary Care | Admitting: Primary Care

## 2024-02-16 DIAGNOSIS — R928 Other abnormal and inconclusive findings on diagnostic imaging of breast: Secondary | ICD-10-CM | POA: Insufficient documentation

## 2024-02-16 DIAGNOSIS — N6489 Other specified disorders of breast: Secondary | ICD-10-CM | POA: Insufficient documentation

## 2024-02-16 HISTORY — PX: BREAST BIOPSY: SHX20

## 2024-02-16 MED ORDER — LIDOCAINE 1 % OPTIME INJ - NO CHARGE
5.0000 mL | Freq: Once | INTRAMUSCULAR | Status: AC
  Administered 2024-02-16: 5 mL
  Filled 2024-02-16: qty 6

## 2024-02-16 MED ORDER — LIDOCAINE-EPINEPHRINE 1 %-1:100000 IJ SOLN
20.0000 mL | Freq: Once | INTRAMUSCULAR | Status: AC
  Administered 2024-02-16: 20 mL
  Filled 2024-02-16: qty 20

## 2024-02-17 LAB — SURGICAL PATHOLOGY

## 2024-03-04 ENCOUNTER — Other Ambulatory Visit: Payer: Self-pay | Admitting: Primary Care

## 2024-03-04 DIAGNOSIS — K219 Gastro-esophageal reflux disease without esophagitis: Secondary | ICD-10-CM

## 2024-03-29 DIAGNOSIS — Z9884 Bariatric surgery status: Secondary | ICD-10-CM

## 2024-03-29 DIAGNOSIS — E1169 Type 2 diabetes mellitus with other specified complication: Secondary | ICD-10-CM

## 2024-03-29 DIAGNOSIS — E559 Vitamin D deficiency, unspecified: Secondary | ICD-10-CM

## 2024-04-04 ENCOUNTER — Ambulatory Visit: Payer: PRIVATE HEALTH INSURANCE | Admitting: Primary Care

## 2024-04-05 ENCOUNTER — Ambulatory Visit: Payer: PRIVATE HEALTH INSURANCE

## 2024-04-05 DIAGNOSIS — Z23 Encounter for immunization: Secondary | ICD-10-CM

## 2024-04-05 NOTE — Progress Notes (Signed)
 Per orders of Mallie Gaskins, DPN AGNP-C, injection of shingrix  given by Laray Arenas in left deltoid. Patient tolerated injection well. This is pts 2nd shingrix .

## 2024-04-11 ENCOUNTER — Other Ambulatory Visit: Payer: PRIVATE HEALTH INSURANCE

## 2024-04-11 DIAGNOSIS — E1169 Type 2 diabetes mellitus with other specified complication: Secondary | ICD-10-CM

## 2024-04-11 DIAGNOSIS — Z6841 Body Mass Index (BMI) 40.0 and over, adult: Secondary | ICD-10-CM | POA: Diagnosis not present

## 2024-04-11 DIAGNOSIS — E785 Hyperlipidemia, unspecified: Secondary | ICD-10-CM | POA: Diagnosis not present

## 2024-04-11 DIAGNOSIS — Z9884 Bariatric surgery status: Secondary | ICD-10-CM | POA: Diagnosis not present

## 2024-04-11 DIAGNOSIS — E559 Vitamin D deficiency, unspecified: Secondary | ICD-10-CM

## 2024-04-11 LAB — COMPREHENSIVE METABOLIC PANEL WITH GFR
ALT: 62 U/L — ABNORMAL HIGH (ref 3–35)
AST: 40 U/L — ABNORMAL HIGH (ref 5–37)
Albumin: 4.1 g/dL (ref 3.5–5.2)
Alkaline Phosphatase: 109 U/L (ref 39–117)
BUN: 11 mg/dL (ref 6–23)
CO2: 25 meq/L (ref 19–32)
Calcium: 9.1 mg/dL (ref 8.4–10.5)
Chloride: 108 meq/L (ref 96–112)
Creatinine, Ser: 0.75 mg/dL (ref 0.40–1.20)
GFR: 92.63 mL/min
Glucose, Bld: 102 mg/dL — ABNORMAL HIGH (ref 70–99)
Potassium: 4.9 meq/L (ref 3.5–5.1)
Sodium: 139 meq/L (ref 135–145)
Total Bilirubin: 0.5 mg/dL (ref 0.2–1.2)
Total Protein: 6.6 g/dL (ref 6.0–8.3)

## 2024-04-11 LAB — IBC + FERRITIN
Ferritin: 63.2 ng/mL (ref 10.0–291.0)
Iron: 162 ug/dL — ABNORMAL HIGH (ref 42–145)
Saturation Ratios: 42.9 % (ref 20.0–50.0)
TIBC: 378 ug/dL (ref 250.0–450.0)
Transferrin: 270 mg/dL (ref 212.0–360.0)

## 2024-04-11 LAB — CBC
HCT: 40.8 % (ref 36.0–46.0)
Hemoglobin: 13.8 g/dL (ref 12.0–15.0)
MCHC: 33.7 g/dL (ref 30.0–36.0)
MCV: 92.9 fl (ref 78.0–100.0)
Platelets: 320 K/uL (ref 150.0–400.0)
RBC: 4.39 Mil/uL (ref 3.87–5.11)
RDW: 13 % (ref 11.5–15.5)
WBC: 6.2 K/uL (ref 4.0–10.5)

## 2024-04-11 LAB — VITAMIN B12: Vitamin B-12: 545 pg/mL (ref 211–911)

## 2024-04-11 LAB — MAGNESIUM: Magnesium: 2.1 mg/dL (ref 1.5–2.5)

## 2024-04-11 LAB — VITAMIN D 25 HYDROXY (VIT D DEFICIENCY, FRACTURES): VITD: 31.91 ng/mL (ref 30.00–100.00)

## 2024-04-11 LAB — FOLATE: Folate: 9 ng/mL

## 2024-04-11 LAB — PHOSPHORUS: Phosphorus: 4.2 mg/dL (ref 2.3–4.6)

## 2024-04-11 LAB — TRANSFERRIN: Transferrin: 270 mg/dL (ref 212.0–360.0)

## 2024-04-12 ENCOUNTER — Ambulatory Visit: Payer: Self-pay | Admitting: Primary Care

## 2024-04-19 ENCOUNTER — Encounter: Payer: Self-pay | Admitting: Primary Care

## 2024-04-19 ENCOUNTER — Ambulatory Visit: Payer: PRIVATE HEALTH INSURANCE | Admitting: Primary Care

## 2024-04-19 VITALS — BP 120/82 | HR 87 | Temp 97.9°F | Ht 61.5 in | Wt 213.2 lb

## 2024-04-19 DIAGNOSIS — M7918 Myalgia, other site: Secondary | ICD-10-CM | POA: Diagnosis not present

## 2024-04-19 LAB — COPPER, SERUM: Copper: 114 ug/dL (ref 70–175)

## 2024-04-19 LAB — VITAMIN A: Vitamin A (Retinoic Acid): 44 ug/dL (ref 38–98)

## 2024-04-19 LAB — VITAMIN K1, SERUM: Vitamin K: 104 pg/mL — ABNORMAL LOW (ref 130–1500)

## 2024-04-19 LAB — ZINC: Zinc: 73 ug/dL (ref 60–130)

## 2024-04-19 LAB — PARATHYROID HORMONE, INTACT (NO CA): PTH: 14 pg/mL — ABNORMAL LOW (ref 16–77)

## 2024-04-19 LAB — VITAMIN B1: Vitamin B1 (Thiamine): 11 nmol/L (ref 8–30)

## 2024-04-19 MED ORDER — METHOCARBAMOL 500 MG PO TABS: 500.0000 mg | ORAL_TABLET | Freq: Three times a day (TID) | ORAL | 0 refills | Status: AC | PRN

## 2024-04-19 MED ORDER — MELOXICAM 15 MG PO TABS: 15.0000 mg | ORAL_TABLET | Freq: Every day | ORAL | 0 refills | Status: DC | PRN

## 2024-04-19 MED ORDER — METHYLPREDNISOLONE ACETATE 80 MG/ML IJ SUSP
80.0000 mg | Freq: Once | INTRAMUSCULAR | Status: AC
  Administered 2024-04-19: 80 mg via INTRAMUSCULAR

## 2024-04-19 NOTE — Progress Notes (Signed)
 "  Subjective:    Patient ID: Jaime Gilmore, female    DOB: 1975/08/04, 48 y.o.   MRN: [id number]  Jaime Gilmore is a very pleasant 48 y.o. female with a history of morbid obesity s/p bariatric surgery, chronic back pain, type 2 diabetes who presents today to discuss buttock pain.   Symptom onset 2 weeks ago with right buttock pain that occurred while cleaning her home. Since then her pain has progressed. Her pain is worse when leading with her right leg, sitting/walking/standing for prolonged periods of time. She describes her pain as a sharp, stabbing pain with tearing.   She denies radiation of pain except for on occasion at night. She denies numbness/tingling. She's been taking her gabapentin  and Advil, Voltaren gel without improvement. She took a half dose of her husbands muscle relaxer medication without improvement.   Wt Readings from Last 3 Encounters:  04/19/24 213 lb 4 oz (96.7 kg)  01/06/24 223 lb (101.2 kg)  12/15/23 224 lb 8 oz (101.8 kg)      Review of Systems  Genitourinary:        No loss of bladder or bowel control.   Musculoskeletal:  Positive for back pain and myalgias.  Neurological:  Negative for numbness.         Past Medical History:  Diagnosis Date   Acute knee pain 06/06/2021   Anxiety    BRCA gene mutation negative in female 06/2015   Cervical high risk HPV (human papillomavirus) test positive 05/15/09;05/22/10;12/16/10;2014   hpv pos   COVID-19 virus infection 02/05/2020   Dysmenorrhea    Family history of breast cancer 06/2015   History of mammogram 02/19/2015   BIRAD I   History of Papanicolaou smear of cervix 06/25/2015   RNIL;NEG   Hyperlipidemia    Increased risk of breast cancer 08/2015   IBIS=24%   Insomnia    Onychomycosis 06/12/2014   Pap smear abnormality of cervix with ASCUS favoring dysplasia 05/22/10;12/16/10;11/27/11;05/30/12   Pap smear abnormality of cervix with LGSIL 05/15/2009   Polycystic ovaries    Type 2 diabetes mellitus  (HCC)    Vitamin D  deficiency     Social History   Socioeconomic History   Marital status: Married    Spouse name: Not on file   Number of children: 0   Years of education: 14   Highest education level: Associate degree: occupational, scientist, product/process development, or vocational program  Occupational History   Occupation: business  Tobacco Use   Smoking status: Former    Current packs/day: 0.00    Types: Cigarettes    Quit date: 2021    Years since quitting: 5.0   Smokeless tobacco: Never  Vaping Use   Vaping status: Never Used  Substance and Sexual Activity   Alcohol use: No   Drug use: No   Sexual activity: Not Currently    Birth control/protection: I.U.D.    Comment: Mirena   Other Topics Concern   Not on file  Social History Narrative   Married.   No children.    Works in HR.   Enjoys watching movies, swimming.    Social Drivers of Health   Tobacco Use: Medium Risk (04/19/2024)   Patient History    Smoking Tobacco Use: Former    Smokeless Tobacco Use: Never    Passive Exposure: Not on file  Financial Resource Strain: Low Risk (12/09/2023)   Overall Financial Resource Strain (CARDIA)    Difficulty of Paying Living Expenses: Not very hard  Food Insecurity:  No Food Insecurity (12/09/2023)   Epic    Worried About Programme Researcher, Broadcasting/film/video in the Last Year: Never true    Ran Out of Food in the Last Year: Never true  Transportation Needs: No Transportation Needs (12/09/2023)   Epic    Lack of Transportation (Medical): No    Lack of Transportation (Non-Medical): No  Physical Activity: Insufficiently Active (12/09/2023)   Exercise Vital Sign    Days of Exercise per Week: 2 days    Minutes of Exercise per Session: 30 min  Stress: Stress Concern Present (12/09/2023)   [employer] of Occupational Health - Occupational Stress Questionnaire    Feeling of Stress: Rather much  Social Connections: Moderately Isolated (12/09/2023)   Social Connection and Isolation Panel    Frequency of  Communication with Friends and Family: More than three times a week    Frequency of Social Gatherings with Friends and Family: More than three times a week    Attends Religious Services: Never    Database Administrator or Organizations: No    Attends Engineer, Structural: Not on file    Marital Status: Married  Catering Manager Violence: Not on file  Depression (PHQ2-9): High Risk (12/07/2023)   Depression (PHQ2-9)    PHQ-2 Score: 15  Alcohol Screen: Not on file  Housing: Unknown (12/09/2023)   Epic    Unable to Pay for Housing in the Last Year: No    Number of Times Moved in the Last Year: Not on file    Homeless in the Last Year: No  Recent Concern: Housing - High Risk (09/16/2023)   Housing Stability Vital Sign    Unable to Pay for Housing in the Last Year: Yes    Number of Times Moved in the Last Year: 0    Homeless in the Last Year: No  Utilities: Not on file  Health Literacy: Not on file    Past Surgical History:  Procedure Laterality Date   BREAST BIOPSY Left 02/16/2024   MM LT BREAST BX W LOC DEV 1ST LESION IMAGE BX SPEC STEREO GUIDE 02/16/2024 [facility]   CERVICAL BIOPSY  W/ LOOP ELECTRODE EXCISION  06/2012   PATH NEG   COLPOSCOPY  05/15/09;2/17;12/3; 5/13   NO LESIONS   GASTRIC BYPASS  11/21/2021   INTRAUTERINE DEVICE (IUD) INSERTION  08/2007   IUD REMOVAL  05/30/2012   LAPAROSCOPIC CHOLECYSTECTOMY  2004   SKIN LESION EXCISION  2007   DERMATOFIBROMA    Family History  Problem Relation Age of Onset   Breast cancer Maternal Aunt 40       no contact   Breast cancer Mother 23   Anemia Mother    High Cholesterol Mother    Hypertension Father    Cancer Maternal Uncle        pancreatic   Diabetes Maternal Grandmother    Hypertension Paternal Grandmother    Hearing loss Paternal Grandmother    Heart disease Paternal Grandmother    High Cholesterol Paternal Grandmother    Cancer Cousin        ovarian- cured, no contact   Hearing loss Paternal  Grandfather    Early death Paternal Grandfather     Allergies[1]  Medications Ordered Prior to Encounter[2]  BP 120/82   Pulse 87   Temp 97.9 F (36.6 C) (Oral)   Ht 5' 1.5 (1.562 m)   Wt 213 lb 4 oz (96.7 kg)   SpO2 96%   BMI 39.64 kg/m  Objective:  Physical Exam Cardiovascular:     Rate and Rhythm: Normal rate.  Pulmonary:     Effort: Pulmonary effort is normal.  Musculoskeletal:     Lumbar back: Negative right straight leg raise test and negative left straight leg raise test.       Legs:  Skin:    General: Skin is warm and dry.  Neurological:     Mental Status: She is alert.     Physical Exam        Assessment & Plan:  Right buttock pain Assessment & Plan: HPI and exam seem like piriformis involvement. No alarm signs.  Treat with methocarbamol 500 mg TID PRN. Drowsiness precautions provided.  Treat with Meloxicam 15 mg once daily PRN. Discussed to avoid other NSAIDS. Treat with Depo Medrol  80 mg IM in office.  We discussed stretching and walking as tolerated.   Follow up as needed.  Orders: -     Methocarbamol; Take 1 tablet (500 mg total) by mouth every 8 (eight) hours as needed for muscle spasms.  Dispense: 15 tablet; Refill: 0 -     Meloxicam; Take 1 tablet (15 mg total) by mouth daily as needed for pain.  Dispense: 30 tablet; Refill: 0    Assessment and Plan Assessment & Plan         [place] MARLA Gaskins, NP       [1] No Known Allergies [2]  Current Outpatient Medications on File Prior to Visit  Medication Sig Dispense Refill   ALPRAZolam  (XANAX ) 0.5 MG tablet Take 1 tablet (0.5 mg total) by mouth daily as needed for anxiety. Use sparingly. 10 tablet 0   atorvastatin  (LIPITOR) 10 MG tablet TAKE 1 TABLET BY MOUTH EVERY DAY FOR CHOLESTEROL 90 tablet 3   busPIRone  (BUSPAR ) 15 MG tablet TAKE 1 TABLET (15 MG TOTAL) BY MOUTH 3 (THREE) TIMES DAILY. FOR ANXIETY. 270 tablet 2   escitalopram  (LEXAPRO ) 20 MG tablet TAKE 1 TABLET (20 MG  TOTAL) BY MOUTH DAILY. FOR ANXIETY. 90 tablet 2   famotidine  (PEPCID ) 20 MG tablet TAKE 1 TABLET BY MOUTH EVERY DAY FOR HEARTBURN 90 tablet 2   gabapentin  (NEURONTIN ) 100 MG capsule TAKE 2 CAPSULES (200 MG TOTAL) BY MOUTH AT BEDTIME. FOR PAIN 180 capsule 2   levonorgestrel  (MIRENA ) 20 MCG/DAY IUD 1 each by Intrauterine route once for 1 dose. 1 each 0   topiramate (TOPAMAX) 50 MG tablet Take 50 mg by mouth daily.     traZODone  (DESYREL ) 100 MG tablet TAKE 1 TABLET (100 MG TOTAL) BY MOUTH AT BEDTIME. FOR SLEEP. 90 tablet 2   No current facility-administered medications on file prior to visit.   "

## 2024-04-19 NOTE — Patient Instructions (Signed)
 You may take the methocarbamol muscle relaxer every 8 hours as needed for muscle spasms.  This may cause drowsiness.  You may take meloxicam once daily for pain and inflammation.  Take with food.  Be sure to stretch as discussed.  It was a pleasure to see you today!

## 2024-04-19 NOTE — Assessment & Plan Note (Signed)
 HPI and exam seem like piriformis involvement. No alarm signs.  Treat with methocarbamol 500 mg TID PRN. Drowsiness precautions provided.  Treat with Meloxicam 15 mg once daily PRN. Discussed to avoid other NSAIDS. Treat with Depo Medrol  80 mg IM in office.  We discussed stretching and walking as tolerated.   Follow up as needed.

## 2024-04-19 NOTE — Addendum Note (Signed)
 Addended by: Calum Cormier on: 04/19/2024 08:02 AM   Modules accepted: Orders

## 2024-05-16 ENCOUNTER — Other Ambulatory Visit: Payer: Self-pay | Admitting: Primary Care

## 2024-05-16 DIAGNOSIS — M7918 Myalgia, other site: Secondary | ICD-10-CM
